# Patient Record
Sex: Female | Born: 1945 | Race: Black or African American | Hispanic: No | Marital: Single | Smoking: Never smoker
Health system: Southern US, Community
[De-identification: ages and names within clinical notes are randomized; demographics above are authoritative.]

## PROBLEM LIST (undated history)

## (undated) DIAGNOSIS — D689 Coagulation defect, unspecified: Secondary | ICD-10-CM

## (undated) DIAGNOSIS — F488 Other specified nonpsychotic mental disorders: Secondary | ICD-10-CM

## (undated) DIAGNOSIS — I10 Essential (primary) hypertension: Secondary | ICD-10-CM

## (undated) DIAGNOSIS — G459 Transient cerebral ischemic attack, unspecified: Secondary | ICD-10-CM

## (undated) DIAGNOSIS — M1990 Unspecified osteoarthritis, unspecified site: Secondary | ICD-10-CM

## (undated) DIAGNOSIS — E785 Hyperlipidemia, unspecified: Secondary | ICD-10-CM

## (undated) DIAGNOSIS — I82409 Acute embolism and thrombosis of unspecified deep veins of unspecified lower extremity: Secondary | ICD-10-CM

## (undated) DIAGNOSIS — M79609 Pain in unspecified limb: Secondary | ICD-10-CM

## (undated) DIAGNOSIS — E119 Type 2 diabetes mellitus without complications: Secondary | ICD-10-CM

## (undated) DIAGNOSIS — I872 Venous insufficiency (chronic) (peripheral): Secondary | ICD-10-CM

## (undated) DIAGNOSIS — K219 Gastro-esophageal reflux disease without esophagitis: Secondary | ICD-10-CM

## (undated) DIAGNOSIS — F32A Depression, unspecified: Secondary | ICD-10-CM

## (undated) DIAGNOSIS — D649 Anemia, unspecified: Secondary | ICD-10-CM

## (undated) DIAGNOSIS — G709 Myoneural disorder, unspecified: Secondary | ICD-10-CM

## (undated) DIAGNOSIS — I2699 Other pulmonary embolism without acute cor pulmonale: Secondary | ICD-10-CM

## (undated) DIAGNOSIS — G4730 Sleep apnea, unspecified: Secondary | ICD-10-CM

## (undated) DIAGNOSIS — L0390 Cellulitis, unspecified: Secondary | ICD-10-CM

## (undated) DIAGNOSIS — Z8719 Personal history of other diseases of the digestive system: Secondary | ICD-10-CM

## (undated) DIAGNOSIS — F329 Major depressive disorder, single episode, unspecified: Secondary | ICD-10-CM

## (undated) HISTORY — DX: Hyperlipidemia, unspecified: E78.5

## (undated) HISTORY — DX: Major depressive disorder, single episode, unspecified: F32.9

## (undated) HISTORY — DX: Acute embolism and thrombosis of unspecified deep veins of unspecified lower extremity: I82.409

## (undated) HISTORY — DX: Transient cerebral ischemic attack, unspecified: G45.9

## (undated) HISTORY — DX: Depression, unspecified: F32.A

## (undated) HISTORY — PX: ABDOMINAL HYSTERECTOMY: SHX81

## (undated) HISTORY — PX: CARPAL TUNNEL RELEASE: SHX101

## (undated) HISTORY — PX: FOOT SURGERY: SHX648

## (undated) HISTORY — PX: JOINT REPLACEMENT: SHX530

## (undated) HISTORY — PX: EYE SURGERY: SHX253

## (undated) HISTORY — DX: Cellulitis, unspecified: L03.90

## (undated) HISTORY — DX: Pain in unspecified limb: M79.609

## (undated) HISTORY — DX: Venous insufficiency (chronic) (peripheral): I87.2

## (undated) HISTORY — PX: REPLACEMENT TOTAL KNEE: SUR1224

## (undated) HISTORY — DX: Unspecified osteoarthritis, unspecified site: M19.90

## (undated) HISTORY — DX: Coagulation defect, unspecified: D68.9

## (undated) HISTORY — DX: Other specified nonpsychotic mental disorders: F48.8

## (undated) HISTORY — DX: Anemia, unspecified: D64.9

## (undated) HISTORY — DX: Other pulmonary embolism without acute cor pulmonale: I26.99

---

## 2009-04-20 ENCOUNTER — Inpatient Hospital Stay (HOSPITAL_COMMUNITY): Admission: EM | Admit: 2009-04-20 | Discharge: 2009-04-21 | Payer: Self-pay | Admitting: Emergency Medicine

## 2010-12-09 LAB — DIFFERENTIAL
Lymphocytes Relative: 6 % — ABNORMAL LOW (ref 12–46)
Lymphs Abs: 0.5 10*3/uL — ABNORMAL LOW (ref 0.7–4.0)
Monocytes Absolute: 0.4 10*3/uL (ref 0.1–1.0)
Monocytes Relative: 4 % (ref 3–12)
Neutro Abs: 8.1 10*3/uL — ABNORMAL HIGH (ref 1.7–7.7)

## 2010-12-09 LAB — URINALYSIS, ROUTINE W REFLEX MICROSCOPIC
Glucose, UA: NEGATIVE mg/dL
Nitrite: NEGATIVE
pH: 5.5 (ref 5.0–8.0)

## 2010-12-09 LAB — CBC
HCT: 38.9 % (ref 36.0–46.0)
MCHC: 32 g/dL (ref 30.0–36.0)
Platelets: 317 10*3/uL (ref 150–400)
RDW: 18.7 % — ABNORMAL HIGH (ref 11.5–15.5)

## 2010-12-09 LAB — HEMOGLOBIN A1C: Hgb A1c MFr Bld: 6.1 % (ref 4.6–6.1)

## 2010-12-09 LAB — PROTIME-INR
INR: 2.6 — ABNORMAL HIGH (ref 0.00–1.49)
INR: 2.9 — ABNORMAL HIGH (ref 0.00–1.49)
Prothrombin Time: 30 seconds — ABNORMAL HIGH (ref 11.6–15.2)

## 2010-12-09 LAB — COMPREHENSIVE METABOLIC PANEL
Albumin: 3.5 g/dL (ref 3.5–5.2)
BUN: 10 mg/dL (ref 6–23)
Calcium: 9 mg/dL (ref 8.4–10.5)
Creatinine, Ser: 0.68 mg/dL (ref 0.4–1.2)
Potassium: 3.7 mEq/L (ref 3.5–5.1)
Total Protein: 7.6 g/dL (ref 6.0–8.3)

## 2010-12-09 LAB — GLUCOSE, CAPILLARY: Glucose-Capillary: 117 mg/dL — ABNORMAL HIGH (ref 70–99)

## 2011-01-16 NOTE — H&P (Signed)
Sharon Glass, Sharon Glass               ACCOUNT NO.:  [id number]   MEDICAL RECORD NO.:  [id number]          PATIENT TYPE:  INP   LOCATION:  3022                         FACILITY:  [facility]   PHYSICIAN:  Virgie Dad, MD     DATE OF BIRTH:  21-Jun-1946   DATE OF ADMISSION:  04/20/2009  DATE OF DISCHARGE:                              HISTORY & PHYSICAL   PRIMARY CARE PHYSICIAN:  Unassigned.   CHIEF COMPLAINT:  Severe headache associated with severe dizziness.   HISTORY OF PRESENT ILLNESS:  The patient had stated that they were on a  bus trip.  While in the bus, she started having sudden severe frontal  headache, the worst headache she ever had.  She became nauseous, but she  did not have any vomiting.  Together with her headache, she felt some  ringing sensation in the head, and she thought she could not hear the  voices around her.  She denied any vomiting, any chest pain, any  weakness.  She said that she been on Zithromax and prednisone and  hydrocortisone cough syrup from her PCP because of acute bronchitis and  that she has asthma, bronchitis, COPD and at this time of the year she  has particular allergy.  She has had this Zithromax, prednisone, and  hydrocodone syrup before.  The patient has stated that the headache was  so severe that she requested to come to the emergency room.     Workup in ED, CT scan did not show any acute intracranial process.  Chest x-ray no acute cardiopulmonary disease.  White count 9100,  hemoglobin 12.4, hematocrit 38.9, platelet count 317, and there is a  neutrophil shift with a white count of 9.1.  She is on Coumadin so PT  was taken and prothrombin time is 27.6, INR of 2.6, sodium 139,  potassium 3.7, glucose 119, BUN 10, creatinine 0.68, calcium 9.  Liver  enzymes negative.  Urinalysis did not show any infection.  She was given  Zofran 4 mg IV for nausea twice, but she continued to be dizzy and  nauseous but no actual vomiting.  She also received  Dilaudid 0.5 mg IV  for her headache but she continues to have headache, meclizine 25 mg  p.o. was also given.  Because of persistent headache, the worst headache  in her life not relieved by Dilaudid and even morphine and severe nausea  not relieved by Zofran and Reglan, the patient was admitted for further  workup.   PAST MEDICAL HISTORY:  She had a TIA 3 years ago and since then on has  been on Coumadin.   CURRENT MEDICAL CONDITIONS:  Asthma, bronchitis, diabetes,  hyperlipidemia, hypertension, pulmonary embolism 5-6 years ago and  history of transient ischemic attack.  Other operations include  hysterectomy, left knee replacement, and carpal tunnel repair of the  right hand.   FAMILY HISTORY:  Hypertension, mother.  Father, hypertension and  diabetes.   CURRENT MEDICATIONS:  Are the following:  1. Advair Diskus twice a day.  2. Aspirin 81 mg once a day.  3. Calcium 600 mg twice a  day.  4. Coumadin 3 mg on odd days and 4 mg on even days.  5. Iron tablet one a day.  6. Furosemide 20 mg daily.  7. Metformin for diabetes 500 mg twice a day.  8. Niaspan for hyperlipidemia 2 at bedtime.  9. Potassium 20 mEq twice a day.  10.Singulair 10 mg daily.  11.Verapamil 120 mg daily.  12.Vitamin D 50,000 units daily.  13.Zetia 10 mg daily and for the past 2 days, prednisone pack.  14.Zithromax once a day.  15.Hydrocodone with acetaminophen cough syrup 1 teaspoon q.4 h.   The patient is diabetic for 7 years.  Asthmatic for a long, long time.  Told to have COPD last year.   PHYSICAL EXAMINATION:  VITAL SIGNS:  On admission, the blood pressure  was 170/80.  When I saw her, the blood pressure was 70/90.  We gave her  IV Lasix 40 mg.  Blood pressure before admission was 140/76.  Pulse is  60, respiration 20, O2 sats at room air 96%.  SKIN:  Warm and dry.  No acute dermatitis.  HEENT:  Pupils are equal and reacting to light.  No nystagmus.  No  papilledema.  No facial asymmetry.  NECK:   Neck veins are flat.  Carotid quiet.  Thyroid gland normal.  CHEST:  Few wheezing.  HEART:  Rate of 60 per minute, regular.  S1 and S2 normal.  No murmur.  No pericardial rub.  ABDOMEN:  Nontender.  No bruit over the aorta.  Normal bowel sounds.  EXTREMITIES:  No DVT.  No pedal edema.  No bruises.  NEUROLOGIC:  Complete neurologic examination.  When the patient is  helped out of bed, she sways side to side and has a tendency to fall  either to the left or the right side.  Romberg test is done, sways side  to side but no nystagmus seen.  Mental exam, Foye Spurling is normal.  Cerebral function normal.  Cerebellar, nose-to-finger-to-nose pointing  ambiguous.  The patient cannot do the test completely because she states  even moving fingers would make her very dizzy and have a headache.  GAIT:  As stated, is ataxic, wide based gait in order to prevent from  falling.  Reflexes are normoactive.  No Babinski.   ASSESSMENT AND PLAN:  1. Severe headache, the worse in her life time associated with      dizziness, with Romberg.  No nystagmus.  Past history of transient      ischemic attack.  Currently, Coumadin for pulmonary embolus history      and transient ischemic attack is therapeutic with an INR of 2.6.      As stated, CT scan does not show any bleed or any acute      intracranial process.  We will continue workup for possible      transient ischemic attack, impending acute cerebrovascular accident      especially the posterior vertebral circulation.  We will order MRI      and MRA and ultrasound of that carotid.  We will not do any deep      venous thrombosis prophylaxis since Coumadin is therapeutic.   1. Hypertension.  Continue furosemide and verapamil.  As stated on      admission, blood pressure was 170/90 and had been given Lasix 40 mg      IV with blood pressure improving to 140/80.  Zofran 4 mg p.r.n. for      nausea and vomiting.  Note, the patient  had received 4 doses of      Zofran  1 morphine, 1 Dilaudid and 1 meclizine.  Nevertheless      continued to have headache and severe dizziness.  We will check      neuro, vital signs.   1. Chronic obstructive pulmonary disease.  Continue Advair.  Stop      hydrocodone with acetaminophen cough syrup.  Stop prednisone and      stop Zithromax.   1. Diabetes.  We will continue metformin.   1. Hyperlipidemia.  We will continue Zetia and Niaspan.   PROGNOSIS:  Guarded.  Concern regarding, severe headache with dizziness,  need to rule out impending CVA from the vertebrobasilar circulation.   ETHICS:  Full code.      Virgie Dad, MD  Electronically Signed     EA/MEDQ  D:  04/20/2009  T:  04/21/2009  Job:  [id number]

## 2011-01-16 NOTE — Discharge Summary (Signed)
Sharon Glass, Sharon Glass               ACCOUNT NO.:  [id number]   MEDICAL RECORD NO.:  [id number]          PATIENT TYPE:  INP   LOCATION:  3022                         FACILITY:  [facility]   PHYSICIAN:  Michelene Gardener, MD    DATE OF BIRTH:  [birth year]   DATE OF ADMISSION:  04/19/2009  DATE OF DISCHARGE:  04/21/2009                               DISCHARGE SUMMARY   DISCHARGE DIAGNOSES:  1. Headache without evidence of cerebrovascular accident in the CAT      scan of her head and MRI of the brain.  2. History of asthma without acute exacerbation.  3. Hypertension.  4. Diabetes mellitus.  5. Hyperlipidemia.  6. History of pulmonary embolism.  7. History of transient ischemic attack.   DISCHARGE MEDICATIONS:  1. Advair Diskus 250/50 one puff twice daily.  2. Coumadin 3 mg in odd days and 4 mg in even days.  3. Lasix 20 mg once a day.  4. Metformin 1000 mg twice daily.  5. Potassium chloride 20 mEq once a day.  6. Singulair 10 mg once a day.  7. Verapamil 120 mg once a day.  8. Zetia 10 mg once a day.  9. Hydrocodone every 4 hours as needed.   CONSULTATIONS:  None.   PROCEDURES:  None.   DIAGNOSTIC STUDIES:  1. Chest x-ray on August 17 showed no acute disease.  2. CT scan of the head without contrast on August 17 showed no      evidence of bleed or any other acute abnormality.  3. MRI of the brain without contrast on August 18 showed artifact      related to dental hardware without acute intracranial abnormality.  4. MRA of the brain showed moderate intracranial atherosclerosis      involving internal carotid artery and proximal basilar artery      without intracranial stenosis.   Follow up with the primary doctor within a week.   COURSE OF HOSPITALIZATION:  This is a 65 year old African American  female, who was admitted to the hospital for evaluation of headache and  possible TIA versus CVA.  The patient presented with severe headache  that initially did not respond to  medications.  The patient had a CAT  scan of her head in the emergency room and that came to be normal.  She  was monitored in telemetry.  Her tele monitor did not show any evidence  of arrhythmia.  The patient responded very well to pain medications  during the hospital.  Her headache resolved.  She does not develop any  new neurological symptoms or signs.  MRI of the brain was done and it  came to be negative.  MRA of the brain was done and it showed no  evidence of stenosis.  Currently, the patient is back to her baseline.  Initially, echocardiogram and carotid Doppler were ordered, but then  were discontinued since her imaging studies were negative for CVA.  To  me, it is only a tension headache, which responded very well to NSAID,  and no further evaluation is needed at this point.  The  patient will be  discharged today in stable condition.  She will follow with her primary  doctor.  She was advised to come to the ER if she develop any new  symptoms.   TOTAL ASSESSMENT TIME:  40 minutes.      Michelene Gardener, MD  Electronically Signed     NAE/MEDQ  D:  04/21/2009  T:  04/21/2009  Job:  [id number] [id number]

## 2011-03-20 ENCOUNTER — Encounter: Payer: Self-pay | Admitting: Vascular Surgery

## 2011-04-10 ENCOUNTER — Encounter: Payer: Self-pay | Admitting: Vascular Surgery

## 2011-04-30 ENCOUNTER — Encounter: Payer: Self-pay | Admitting: Vascular Surgery

## 2011-05-04 ENCOUNTER — Encounter: Payer: Self-pay | Admitting: Vascular Surgery

## 2011-05-08 ENCOUNTER — Ambulatory Visit (INDEPENDENT_AMBULATORY_CARE_PROVIDER_SITE_OTHER): Payer: Medicare Other | Admitting: Vascular Surgery

## 2011-05-08 ENCOUNTER — Encounter: Payer: Self-pay | Admitting: Vascular Surgery

## 2011-05-08 VITALS — Resp 22 | Ht 69.0 in | Wt 258.0 lb

## 2011-05-08 DIAGNOSIS — R609 Edema, unspecified: Secondary | ICD-10-CM

## 2011-05-08 DIAGNOSIS — I83893 Varicose veins of bilateral lower extremities with other complications: Secondary | ICD-10-CM

## 2011-05-08 NOTE — Progress Notes (Signed)
Subjective:     Patient ID: Sharon Glass, female   DOB: 19-Dec-1945, 65 y.o.   MRN: [id number]  HPI this 65 year old female was referred for swelling in the left leg. She has a history of a DVT in the left leg in the 1990s. She was treated briefly with Coumadin. A few years ago she had left knee surgery and postoperatively had a pulmonary embolus. She has been on Coumadin therapy since that time. She has had no recurrent clots in the left leg to her knowledge. The left leg below the knee has been swelling for the last 3-4 months she states that the foot has been swelling for much longer than that. She has no history of stasis ulcers or bleeding. She has been treated recently with anunna boot with some improvement but not for a stasis ulcer. Past Medical History  Diagnosis Date  . Nervous breakdown   . Hyperlipidemia   . Pulmonary embolism   . Asthma   . Cellulitis   . DVT (deep venous thrombosis)   . Venous insufficiency   . TIA (transient ischemic attack)   . Pain in limb   . Arthritis   . Anemia   . Clotting disorder   . Depression     History  Substance Use Topics  . Smoking status: Never Smoker   . Smokeless tobacco: Not on file  . Alcohol Use: No    Family History  Problem Relation Age of Onset  . Diabetes Father     No Known Allergies  Current outpatient prescriptions:amLODipine-benazepril (LOTREL) 10-20 MG per capsule, Take 1 capsule by mouth daily.  , Disp: , Rfl: ;  aspirin 81 MG tablet, Take 81 mg by mouth daily.  , Disp: , Rfl: ;  atenolol (TENORMIN) 100 MG tablet, Take 100 mg by mouth daily. Half a tab daily , Disp: , Rfl: ;  atorvastatin (LIPITOR) 80 MG tablet, Take 80 mg by mouth daily. At bedtime , Disp: , Rfl:  b complex vitamins tablet, Take 1 tablet by mouth daily.  , Disp: , Rfl: ;  calcium carbonate 1250 MG capsule, Take 1,250 mg by mouth 2 (two) times daily with a meal.  , Disp: , Rfl: ;  Fe Fumarate-B12-Vit C-FA-IFC (FEROCON PO), Take by mouth.  , Disp: , Rfl:  ;  fluticasone-salmeterol (ADVAIR HFA) 115-21 MCG/ACT inhaler, Inhale 2 puffs into the lungs 2 (two) times daily.  , Disp: , Rfl:  furosemide (LASIX) 10 MG/ML solution, Take by mouth daily.  , Disp: , Rfl: ;  gabapentin (NEURONTIN) 100 MG capsule, Take 100 mg by mouth 3 (three) times daily.  , Disp: , Rfl: ;  losartan (COZAAR) 100 MG tablet, Take 100 mg by mouth daily.  , Disp: , Rfl: ;  metFORMIN (GLUMETZA) 1000 MG (MOD) 24 hr tablet, Take 1,000 mg by mouth daily with breakfast.  , Disp: , Rfl:  montelukast (SINGULAIR) 10 MG tablet, Take 10 mg by mouth at bedtime.  , Disp: , Rfl: ;  potassium acetate 2 MEQ/ML injection, Inject into the vein.  , Disp: , Rfl: ;  triamcinolone (NASACORT) 55 MCG/ACT nasal inhaler, Place 2 sprays into the nose daily.  , Disp: , Rfl: ;  urea (CARMOL) 10 % cream, Apply topically as needed.  , Disp: , Rfl: ;  Vitamin D, Ergocalciferol, (DRISDOL) 50000 UNITS CAPS, Take 50,000 Units by mouth.  , Disp: , Rfl:  warfarin (COUMADIN) 1 MG tablet, Take 4 mg by mouth as directed. Tues., Thurs., Sat.,  Sun. 1 tab, Disp: , Rfl: ;  warfarin (COUMADIN) 1 MG tablet, Take 3 mg by mouth as directed. Mon., Wed., Fri. 1 tab , Disp: , Rfl:   Resp 22  Ht 5\' 9"  (1.753 m)  Wt 258 lb (117.028 kg)  BMI 38.10 kg/m2  Body mass index is 38.10 kg/(m^2).        Review of Systems review of systems is positive for leg discomfort with walking, numbness in the feet, asthma, arthritis, clotting disorder, anemia, and depression. All other systems are negative and complete review of systems     Objective:   Physical Exam blood pressure 140/70 heart rate 70 respirations 20 Generally she is an obese female is in no apparent distress alert and oriented x3 Cardiovascular regular rate and rhythm with no murmur carotid pulses 3+ no audible bruits HEENT normal for age Chest clear with no rhonchi or wheezing Abdomen is obese no palpable masses 3+ femoral popliteal and dorsalis pedis pulses palpable  bilaterally left leg has 1-2+ edema from the knee distally no varicosities are noted. No hyperpigmentation or active ulceration is noted. Neuro neuro decreased sensation in both feet Musculoskeletal no obvious joint deformities Skin free of rashes    Assessment:     Today I ordered a venous duplex exam which I reviewed and interpreted. There is no DVT. Right saphenous vein has no evidence of reflux in the superficial system. I think the patient has welling in the left leg due to previous DVT and valvular injury to the deep veins. He does continue on Coumadin for her pulmonary embolus.    Plan:     #1 elevation of the foot of the bed 2-3 inches at night #2 short-leg elastic compression stocking to be placed for sling in the morning before arising from bed #3 no further suggestions

## 2011-05-16 NOTE — Procedures (Unsigned)
DUPLEX DEEP VENOUS EXAM - LOWER EXTREMITY  INDICATION:  Left lower extremity edema.  HISTORY:  Edema:  Yes. Trauma/Surgery:  No. Pain:  Yes. PE:  No. Previous DVT:  No. Anticoagulants:  No. Other:  DUPLEX EXAM:               CFV   SFV   PopV  PTV    GSV               R  L  R  L  R  L  R   L  R  L Thrombosis    o  o     o     o      o     o Spontaneous   +  +     +     +      +     + Phasic        +  +     +     +      +     + Augmentation  +  +     +     +      +     + Compressible  +  +     +     +      +     + Competent     +  +     +     +      +     +  Legend:  + - yes  o - no  p - partial  D - decreased   IMPRESSION: 1. No evidence of deep vein thrombosis identified in the left lower     extremity. 2. Patent left great saphenous vein with no evidence of reflux     identified. 3. Contralateral common femoral vein is patent and has normal     spontaneous and phasic flow. 4. Vascularized lymph nodes present involving the bilateral proximal     medial thighs.        _____________________________ Quita Skye. Hart Rochester, M.D.  SH/MEDQ  D:  05/08/2011  T:  05/08/2011  Job:  [id number]

## 2021-05-23 ENCOUNTER — Ambulatory Visit: Payer: Self-pay | Admitting: Student

## 2021-06-15 ENCOUNTER — Encounter (HOSPITAL_COMMUNITY): Payer: Medicare Other

## 2021-06-22 ENCOUNTER — Ambulatory Visit: Admit: 2021-06-22 | Payer: Medicare Other | Admitting: Orthopedic Surgery

## 2021-06-22 SURGERY — ARTHROPLASTY, KNEE, TOTAL, USING IMAGELESS COMPUTER-ASSISTED NAVIGATION
Anesthesia: Spinal | Site: Knee | Laterality: Right

## 2022-02-02 ENCOUNTER — Ambulatory Visit: Payer: Self-pay | Admitting: Student

## 2022-02-02 DIAGNOSIS — E119 Type 2 diabetes mellitus without complications: Secondary | ICD-10-CM

## 2022-02-06 ENCOUNTER — Telehealth: Payer: Self-pay

## 2022-02-06 ENCOUNTER — Other Ambulatory Visit: Payer: Self-pay

## 2022-02-06 DIAGNOSIS — Z86711 Personal history of pulmonary embolism: Secondary | ICD-10-CM

## 2022-02-06 NOTE — Telephone Encounter (Signed)
Received a call from [facility] with [facility]/Dr. Swinteck's office requesting placement of IVC filter with Dr. Randie Heinz prior to her TKR on 02/21/22 due to history of pulmonary embolism.   Spoke with patient who agreed to schedule procedure on 02/12/22 at [facility]. Instructions reviewed and patient verbalized understanding. Dr. Kathline Magic office informed of appointment.

## 2022-02-12 ENCOUNTER — Ambulatory Visit (HOSPITAL_COMMUNITY)
Admission: RE | Admit: 2022-02-12 | Discharge: 2022-02-12 | Disposition: A | Discharge status: Home / Self Care | Payer: Medicare Other | Attending: Vascular Surgery | Admitting: Vascular Surgery

## 2022-02-12 ENCOUNTER — Encounter (HOSPITAL_COMMUNITY): Admission: RE | Admit: 2022-02-12 | Payer: Medicare Other | Source: Ambulatory Visit

## 2022-02-12 ENCOUNTER — Encounter (HOSPITAL_COMMUNITY)
Admission: RE | Disposition: A | Discharge status: Home / Self Care | Payer: Self-pay | Source: Home / Self Care | Attending: Vascular Surgery

## 2022-02-12 ENCOUNTER — Other Ambulatory Visit: Payer: Self-pay

## 2022-02-12 DIAGNOSIS — Z86711 Personal history of pulmonary embolism: Secondary | ICD-10-CM | POA: Insufficient documentation

## 2022-02-12 DIAGNOSIS — Z408 Encounter for other prophylactic surgery: Secondary | ICD-10-CM | POA: Insufficient documentation

## 2022-02-12 DIAGNOSIS — M25561 Pain in right knee: Secondary | ICD-10-CM | POA: Diagnosis not present

## 2022-02-12 DIAGNOSIS — Z01810 Encounter for preprocedural cardiovascular examination: Secondary | ICD-10-CM | POA: Diagnosis not present

## 2022-02-12 DIAGNOSIS — Z86718 Personal history of other venous thrombosis and embolism: Secondary | ICD-10-CM | POA: Insufficient documentation

## 2022-02-12 HISTORY — PX: IVC FILTER INSERTION: CATH118245

## 2022-02-12 LAB — POCT I-STAT, CHEM 8
BUN: 16 mg/dL (ref 8–23)
Calcium, Ion: 1.26 mmol/L (ref 1.15–1.40)
Chloride: 104 mmol/L (ref 98–111)
Creatinine, Ser: 0.8 mg/dL (ref 0.44–1.00)
Glucose, Bld: 100 mg/dL — ABNORMAL HIGH (ref 70–99)
HCT: 39 % (ref 36.0–46.0)
Hemoglobin: 13.3 g/dL (ref 12.0–15.0)
Potassium: 4 mmol/L (ref 3.5–5.1)
Sodium: 143 mmol/L (ref 135–145)
TCO2: 27 mmol/L (ref 22–32)

## 2022-02-12 SURGERY — IVC FILTER INSERTION
Anesthesia: LOCAL

## 2022-02-12 MED ORDER — SODIUM CHLORIDE 0.9 % IV SOLN: INTRAVENOUS | Status: DC

## 2022-02-12 MED ORDER — MIDAZOLAM HCL 2 MG/2ML IJ SOLN
INTRAMUSCULAR | Status: DC | PRN
  Administered 2022-02-12: 1 mg via INTRAVENOUS

## 2022-02-12 MED ORDER — LIDOCAINE HCL (PF) 1 % IJ SOLN
INTRAMUSCULAR | Status: DC | PRN
  Administered 2022-02-12: 15 mL

## 2022-02-12 MED ORDER — HEPARIN (PORCINE) IN NACL 1000-0.9 UT/500ML-% IV SOLN
INTRAVENOUS | Status: DC | PRN
  Administered 2022-02-12: 500 mL

## 2022-02-12 MED ORDER — HEPARIN (PORCINE) IN NACL 1000-0.9 UT/500ML-% IV SOLN
INTRAVENOUS | Status: AC
  Filled 2022-02-12: qty 500

## 2022-02-12 MED ORDER — FENTANYL CITRATE (PF) 100 MCG/2ML IJ SOLN
INTRAMUSCULAR | Status: AC
  Filled 2022-02-12: qty 2

## 2022-02-12 MED ORDER — FENTANYL CITRATE (PF) 100 MCG/2ML IJ SOLN
INTRAMUSCULAR | Status: DC | PRN
  Administered 2022-02-12: 25 ug via INTRAVENOUS

## 2022-02-12 MED ORDER — IODIXANOL 320 MG/ML IV SOLN
INTRAVENOUS | Status: DC | PRN
  Administered 2022-02-12: 30 mL

## 2022-02-12 MED ORDER — MIDAZOLAM HCL 2 MG/2ML IJ SOLN
INTRAMUSCULAR | Status: AC
  Filled 2022-02-12: qty 2

## 2022-02-12 SURGICAL SUPPLY — 9 items
BAG SNAP BAND KOVER 36X36 (MISCELLANEOUS) ×1 IMPLANT
FILTER VC CELECT-FEMORAL (Filter) ×1 IMPLANT
KIT MICROPUNCTURE NIT STIFF (SHEATH) ×1 IMPLANT
KIT PV (KITS) ×2 IMPLANT
SYR MEDRAD MARK V 150ML (SYRINGE) ×1 IMPLANT
TRANSDUCER W/STOPCOCK (MISCELLANEOUS) ×2 IMPLANT
TRAY PV CATH (CUSTOM PROCEDURE TRAY) ×2 IMPLANT
TUBING INJECTOR 48 (MISCELLANEOUS) ×1 IMPLANT
WIRE BENTSON .035X145CM (WIRE) ×1 IMPLANT

## 2022-02-12 NOTE — Op Note (Signed)
    Patient name: Sharon Glass MRN: [id number] DOB: [birth year] Sex: female  02/12/2022 Pre-operative Diagnosis: History of pulmonary embolus with need for right total knee arthroplasty Post-operative diagnosis:  Same Surgeon:  Luanna Salk. Randie Heinz, MD Procedure Performed: 1.  Ultrasound-guided cannulation right common femoral vein 2.  IVC venogram 3.  Placement of Cook Celect IVC filter 4.  Moderate sedation with fentanyl and Versed for 9 minutes  Indications: 76 year old female history of pulmonary embolus from left lower extremity DVT now indicated for right total knee arthroplasty.  We are placing a filter to prevent patient from pulmonary embolus in the perioperative timeframe.  Findings: The IVC measures less than 20 mm.  The filter was deployed in an upright position at the bottom of L2 where the left renal vein was identified by venogram.  We will plan to remove filter and 3 months time if patient has recovered from her surgery without any further plan procedures   Procedure:  The patient was identified in the holding area and taken to room 8.  The patient was then placed supine on the table and prepped and draped in the usual sterile fashion.  A time out was called.  Ultrasound was used to evaluate the right common femoral vein which was patent and compressible.  The area was anesthetized with 1% lidocaine cannulated micropuncture needle followed by wire and sheath.  Images saved the permanent record.  Bentson wire was placed followed by the introducer sheath over the wire.  Venogram was performed.  We placed the introducer sheath to the level of the left renal vein at the bottom of L2.  The filter was then deployed at that location.  An image was saved.  Sheath was removed and pressure held till hemostasis obtained.  She tolerated procedure well without any complication.  Contrast: 30cc   Sua Spadafora C. Randie Heinz, MD [facility] Office: [phone] Pager:  [phone]

## 2022-02-12 NOTE — Progress Notes (Signed)
error 

## 2022-02-12 NOTE — Progress Notes (Signed)
1400- patient ambulated w/ assist to BR. Right groin with dressing cdi.

## 2022-02-12 NOTE — H&P (Signed)
H+P   History of Present Illness: This is a 76 y.o. female with plan for total knee replacement later this month.  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  She is planning for right total knee arthroplasty no future plans for left knee surgery or hip surgery.  She has been consented for filter and presents today.  Past Medical History:  Diagnosis Date   Anemia    Arthritis    Asthma    Cellulitis    Clotting disorder ([place])    Depression    DVT (deep venous thrombosis) (HCC)    Hyperlipidemia    Nervous breakdown    Pain in limb    Pulmonary embolism (HCC)    TIA (transient ischemic attack)    Venous insufficiency     Past Surgical History:  Procedure Laterality Date   ABDOMINAL HYSTERECTOMY     CARPAL TUNNEL RELEASE     FOOT SURGERY     JOINT REPLACEMENT     REPLACEMENT TOTAL KNEE     left    No Known Allergies  Prior to Admission medications   Medication Sig Start Date End Date Taking? Authorizing Provider  amLODipine (NORVASC) 10 MG tablet Take 10 mg by mouth daily.    [provider]  atorvastatin (LIPITOR) 80 MG tablet Take 80 mg by mouth at bedtime.    [provider]  cephALEXin (KEFLEX) 250 MG capsule Take 250 mg by mouth daily. 90 day supply. Started 01/19/22 01/19/22   [provider]  cetirizine (ZYRTEC) 10 MG tablet Take 10 mg by mouth daily.    [provider]  Cyanocobalamin (B-12) 2500 MCG TABS Take 2,500 mcg by mouth daily.    [provider]  donepezil (ARICEPT) 5 MG tablet Take 5 mg by mouth at bedtime.    [provider]  escitalopram (LEXAPRO) 20 MG tablet Take 20 mg by mouth daily. 01/21/22   [provider]  Evening Primrose Oil 500 MG CAPS Take 500 mg by mouth in the morning, at noon, and at bedtime.    [provider]  famotidine (PEPCID) 20 MG tablet Take 20 mg by mouth 2 (two) times daily.    [provider]  ferrous sulfate 325 (65  FE) MG tablet Take 325 mg by mouth daily with breakfast.    [provider]  fluticasone furoate-vilanterol (BREO ELLIPTA) 100-25 MCG/ACT AEPB Inhale 1 puff into the lungs daily as needed (asthma).    [provider]  irbesartan (AVAPRO) 300 MG tablet Take 300 mg by mouth daily. 01/19/22   [provider]  memantine (NAMENDA) 10 MG tablet Take 10 mg by mouth 2 (two) times daily.    [provider]  metFORMIN (GLUCOPHAGE) 500 MG tablet Take 500 mg by mouth daily with breakfast. 01/19/22   [provider]  metoprolol tartrate (LOPRESSOR) 25 MG tablet Take 25 mg by mouth 2 (two) times daily.    [provider]  montelukast (SINGULAIR) 10 MG tablet Take 10 mg by mouth at bedtime.      [provider]  pregabalin (LYRICA) 50 MG capsule Take 50 mg by mouth 3 (three) times daily.    [provider]  vitamin E 180 MG (400 UNITS) capsule Take 400 Units by mouth daily.    [provider]    Social History   Socioeconomic History   Marital status: Single    Spouse name: Not on file   Number  of children: Not on file   Years of education: Not on file   Highest education level: Not on file  Occupational History   Not on file  Tobacco Use   Smoking status: Never   Smokeless tobacco: Not on file  Substance and Sexual Activity   Alcohol use: No   Drug use: No   Sexual activity: Not on file  Other Topics Concern   Not on file  Social History Narrative   Not on file   Social Determinants of Health   Financial Resource Strain: Not on file  Food Insecurity: Not on file  Transportation Needs: Not on file  Physical Activity: Not on file  Stress: Not on file  Social Connections: Not on file  Intimate Partner Violence: Not on file     Family History  Problem Relation Age of Onset   Diabetes Father     ROS: No complaints today   Physical Examination Vitals:   02/12/22 1142  BP: 133/71  Pulse: (!) 53  Temp:  (!) 97.4 F (36.3 C)  SpO2: 100%   Awake alert oriented Nonlabored respirations No swelling bilateral lower extremities   CBC    Component Value Date/Time   WBC 9.1 04/19/2009 1548   RBC 5.01 04/19/2009 1548   HGB 12.4 04/19/2009 1548   HCT 38.9 04/19/2009 1548   PLT 317 04/19/2009 1548   MCV 77.6 (L) 04/19/2009 1548   MCHC 32.0 04/19/2009 1548   RDW 18.7 (H) 04/19/2009 1548   LYMPHSABS 0.5 (L) 04/19/2009 1548   MONOABS 0.4 04/19/2009 1548   EOSABS 0.0 04/19/2009 1548   BASOSABS 0.1 04/19/2009 1548    BMET    Component Value Date/Time   NA 139 04/19/2009 1548   K 3.7 04/19/2009 1548   CL 106 04/19/2009 1548   CO2 28 04/19/2009 1548   GLUCOSE 119 (H) 04/19/2009 1548   BUN 10 04/19/2009 1548   CREATININE 0.68 04/19/2009 1548   CALCIUM 9.0 04/19/2009 1548   GFRNONAA >60 04/19/2009 1548   GFRAA  04/19/2009 1548    >60        The eGFR has been calculated using the MDRD equation. This calculation has not been validated in all clinical situations. eGFR's persistently <60 mL/min signify possible Chronic Kidney Disease.    COAGS: Lab Results  Component Value Date   INR 2.9 (H) 04/21/2009   INR 2.9 (H) 04/20/2009   INR 2.6 (H) 04/19/2009     Non-Invasive Vascular Imaging:   No studies  ASSESSMENT/PLAN: This is a 76 y.o. female history of pulmonary embolism now plan for total knee replacement in 9 days from now.  Plan will be to place IVC filter today and will plan for removal in 3 months if she has recovered from her surgery.   Sharon Glass C. Donzetta Matters, MD [facility] Office: [phone] Pager: [phone]

## 2022-02-13 ENCOUNTER — Encounter (HOSPITAL_COMMUNITY): Payer: Self-pay | Admitting: Vascular Surgery

## 2022-02-15 ENCOUNTER — Other Ambulatory Visit (HOSPITAL_COMMUNITY): Payer: Self-pay

## 2022-02-16 ENCOUNTER — Other Ambulatory Visit (HOSPITAL_COMMUNITY): Payer: Self-pay | Admitting: *Deleted

## 2022-02-19 ENCOUNTER — Encounter (HOSPITAL_COMMUNITY): Payer: Self-pay

## 2022-02-19 ENCOUNTER — Ambulatory Visit: Payer: Self-pay | Admitting: Student

## 2022-02-19 ENCOUNTER — Other Ambulatory Visit: Payer: Self-pay

## 2022-02-19 ENCOUNTER — Encounter (HOSPITAL_COMMUNITY)
Admission: RE | Admit: 2022-02-19 | Discharge: 2022-02-19 | Disposition: A | Discharge status: Home / Self Care | Payer: Medicare Other | Source: Ambulatory Visit | Attending: Orthopedic Surgery | Admitting: Orthopedic Surgery

## 2022-02-19 DIAGNOSIS — Z01818 Encounter for other preprocedural examination: Secondary | ICD-10-CM

## 2022-02-19 DIAGNOSIS — Z01812 Encounter for preprocedural laboratory examination: Secondary | ICD-10-CM | POA: Diagnosis present

## 2022-02-19 DIAGNOSIS — E119 Type 2 diabetes mellitus without complications: Secondary | ICD-10-CM | POA: Insufficient documentation

## 2022-02-19 HISTORY — DX: Sleep apnea, unspecified: G47.30

## 2022-02-19 HISTORY — DX: Personal history of other diseases of the digestive system: Z87.19

## 2022-02-19 HISTORY — DX: Essential (primary) hypertension: I10

## 2022-02-19 HISTORY — DX: Myoneural disorder, unspecified: G70.9

## 2022-02-19 HISTORY — DX: Type 2 diabetes mellitus without complications: E11.9

## 2022-02-19 HISTORY — DX: Gastro-esophageal reflux disease without esophagitis: K21.9

## 2022-02-19 LAB — CBC
HCT: 37.3 % (ref 36.0–46.0)
Hemoglobin: 11.3 g/dL — ABNORMAL LOW (ref 12.0–15.0)
MCH: 26.5 pg (ref 26.0–34.0)
MCHC: 30.3 g/dL (ref 30.0–36.0)
MCV: 87.6 fL (ref 80.0–100.0)
Platelets: 264 10*3/uL (ref 150–400)
RBC: 4.26 MIL/uL (ref 3.87–5.11)
RDW: 17.3 % — ABNORMAL HIGH (ref 11.5–15.5)
WBC: 8.9 10*3/uL (ref 4.0–10.5)
nRBC: 0 % (ref 0.0–0.2)

## 2022-02-19 LAB — BASIC METABOLIC PANEL
Anion gap: 7 (ref 5–15)
BUN: 25 mg/dL — ABNORMAL HIGH (ref 8–23)
CO2: 26 mmol/L (ref 22–32)
Calcium: 9.2 mg/dL (ref 8.9–10.3)
Chloride: 109 mmol/L (ref 98–111)
Creatinine, Ser: 0.83 mg/dL (ref 0.44–1.00)
GFR, Estimated: >60 mL/min (ref >60)
Glucose, Bld: 101 mg/dL — ABNORMAL HIGH (ref 70–99)
Potassium: 4.3 mmol/L (ref 3.5–5.1)
Sodium: 142 mmol/L (ref 135–145)

## 2022-02-19 LAB — HEMOGLOBIN A1C
Hgb A1c MFr Bld: 5.5 % (ref 4.8–5.6)
Mean Plasma Glucose: 111.15 mg/dL

## 2022-02-19 LAB — SURGICAL PCR SCREEN
MRSA, PCR: NEGATIVE
Staphylococcus aureus: NEGATIVE

## 2022-02-19 LAB — GLUCOSE, CAPILLARY: Glucose-Capillary: 91 mg/dL (ref 70–99)

## 2022-02-19 NOTE — Progress Notes (Addendum)
PCP - Sharion Balloon clearance on chart 01-01-22 with LOV  Cardiologist - Dr. Clent Ridges  [facility] pulmonarry clearance on chart 12-13-21 with LOV note  Dr. [facility] LOV note on chart 12-28-21 PFT on chart  PPM/ICD -  Device Orders -  Rep Notified -   Chest x-ray -  EKG - 02-12-22 epic Stress Test -  ECHO -  Cardiac Cath -   Sleep Study -  CPAP -   Fasting Blood Sugar - 90's Checks Blood Sugar __occasional___ Blood Thinner Instructions: Aspirin Instructions:no  ERAS Protcol - PRE-SURGERY Ensure or G2-    COVID vaccine -  Activity--Able to walk a flight of stairs without SOB   Anesthesia review: DVT, TIA, PE HTN, IVC filter placed 02-12-22  Patient denies shortness of breath, fever, cough and chest pain at PAT appointment   All instructions explained to the patient, with a verbal understanding of the material. Patient agrees to go over the instructions while at home for a better understanding. Patient also instructed to self quarantine after being tested for COVID-19. The opportunity to ask questions was provided.

## 2022-02-19 NOTE — H&P (View-Only) (Signed)
TOTAL KNEE ADMISSION H&P  Patient is being admitted for right total knee arthroplasty.  Subjective:  Chief Complaint:right knee pain.  HPI: Sharon Glass, 76 y.o. female, has a history of pain and functional disability in the right knee due to arthritis and has failed non-surgical conservative treatments for greater than 12 weeks to includeNSAID's and/or analgesics, corticosteriod injections, flexibility and strengthening excercises, supervised PT with diminished ADL's post treatment, use of assistive devices, weight reduction as appropriate, and activity modification.  Onset of symptoms was gradual, starting 8 years ago with rapidlly worsening course since that time. The patient noted no past surgery on the right knee(s).  Patient currently rates pain in the right knee(s) at 10 out of 10 with activity. Patient has night pain, worsening of pain with activity and weight bearing, pain that interferes with activities of daily living, pain with passive range of motion, crepitus, and joint swelling.  Patient has evidence of subchondral cysts, subchondral sclerosis, periarticular osteophytes, and joint space narrowing by imaging studies. There is no active infection.  There are no problems to display for this patient.  Past Medical History:  Diagnosis Date   Anemia    Arthritis    Asthma    Cellulitis    Clotting disorder (HCC)    Depression    Diabetes mellitus without complication (HCC)    DVT (deep venous thrombosis) (HCC)    GERD (gastroesophageal reflux disease)    History of hiatal hernia    Hyperlipidemia    Hypertension    Nervous breakdown    Neuromuscular disorder (HCC)    neuropathy feet   Pain in limb    Pulmonary embolism (HCC)    Sleep apnea    wears CPAP   TIA (transient ischemic attack)    Venous insufficiency     Past Surgical History:  Procedure Laterality Date   ABDOMINAL HYSTERECTOMY     CARPAL TUNNEL RELEASE     EYE SURGERY Bilateral    cataract   FOOT SURGERY  Left    IVC FILTER INSERTION N/A 02/12/2022   Procedure: IVC FILTER INSERTION;  Surgeon: Maeola Harman, MD;  Location: [facility] INVASIVE CV LAB;  Service: Cardiovascular;  Laterality: N/A;   REPLACEMENT TOTAL KNEE     left    Current Outpatient Medications  Medication Sig Dispense Refill Last Dose   amLODipine (NORVASC) 10 MG tablet Take 10 mg by mouth daily.      atorvastatin (LIPITOR) 80 MG tablet Take 80 mg by mouth at bedtime.      cephALEXin (KEFLEX) 250 MG capsule Take 250 mg by mouth daily. 90 day supply. Started 01/19/22      cetirizine (ZYRTEC) 10 MG tablet Take 10 mg by mouth daily.      Cyanocobalamin (B-12) 2500 MCG TABS Take 2,500 mcg by mouth daily.      donepezil (ARICEPT) 5 MG tablet Take 5 mg by mouth at bedtime.      escitalopram (LEXAPRO) 20 MG tablet Take 20 mg by mouth daily.      Evening Primrose Oil 500 MG CAPS Take 500 mg by mouth in the morning, at noon, and at bedtime.      famotidine (PEPCID) 20 MG tablet Take 20 mg by mouth 2 (two) times daily.      ferrous sulfate 325 (65 FE) MG tablet Take 325 mg by mouth daily with breakfast.      fluticasone furoate-vilanterol (BREO ELLIPTA) 100-25 MCG/ACT AEPB Inhale 1 puff into the lungs daily as needed (  asthma).      irbesartan (AVAPRO) 300 MG tablet Take 300 mg by mouth daily.      memantine (NAMENDA) 10 MG tablet Take 10 mg by mouth 2 (two) times daily.      metFORMIN (GLUCOPHAGE) 500 MG tablet Take 500 mg by mouth daily with breakfast.      metoprolol tartrate (LOPRESSOR) 25 MG tablet Take 25 mg by mouth 2 (two) times daily.      montelukast (SINGULAIR) 10 MG tablet Take 10 mg by mouth at bedtime.        pregabalin (LYRICA) 50 MG capsule Take 50 mg by mouth 3 (three) times daily.      vitamin E 180 MG (400 UNITS) capsule Take 400 Units by mouth daily.      No current facility-administered medications for this visit.   No Known Allergies  Social History   Tobacco Use   Smoking status: Never   Smokeless  tobacco: Not on file  Substance Use Topics   Alcohol use: Never    Family History  Problem Relation Age of Onset   Diabetes Father      Review of Systems  Cardiovascular:  Positive for leg swelling.  Musculoskeletal:  Positive for arthralgias, gait problem, joint swelling and myalgias.  All other systems reviewed and are negative.   Objective:  Physical Exam Constitutional:      Appearance: Normal appearance. She is obese.  HENT:     Head: Normocephalic and atraumatic.     Nose: Nose normal.     Mouth/Throat:     Mouth: Mucous membranes are moist.     Pharynx: Oropharynx is clear.  Eyes:     Extraocular Movements: Extraocular movements intact.  Cardiovascular:     Rate and Rhythm: Normal rate and regular rhythm.     Pulses: Normal pulses.     Heart sounds: Normal heart sounds.  Pulmonary:     Breath sounds: Normal breath sounds.  Abdominal:     General: Abdomen is flat.     Palpations: Abdomen is soft.  Genitourinary:    Comments: deferred Musculoskeletal:     Cervical back: Normal range of motion and neck supple.     Comments: Examination of the right knee examination of the right knee reveals no skin wounds, lesions, erythema, or rashes. She has some swelling. Tenderness palpation medial joint line, lateral joint line, peripatellar retinacular tissues with positive grind sign. Valgus deformity. Range of motion 0 to 105 degrees. Painless range of motion of the hip.  Sensory and motor function intact in LE bilaterally. Distal pulses 2+ bilaterally.  Mild pedal edema at baseline. Calves soft and non-tender.   Skin:    General: Skin is warm and dry.     Capillary Refill: Capillary refill takes less than 2 seconds.  Neurological:     General: No focal deficit present.     Mental Status: She is alert and oriented to person, place, and time.  Psychiatric:        Mood and Affect: Mood normal.        Behavior: Behavior normal.        Thought Content: Thought content  normal.        Judgment: Judgment normal.     Vital signs in last 24 hours: @VSRANGES @  Labs:   Estimated body mass index is 38.55 kg/m as calculated from the following:   Height as of an earlier encounter on 02/19/22: 5\' 9"  (1.753 m).   Weight as of  an earlier encounter on 02/19/22: 118.4 kg.   Imaging Review Plain radiographs demonstrate severe degenerative joint disease of the right knee(s). The overall alignment ismild valgus. The bone quality appears to be adequate for age and reported activity level.      Assessment/Plan:  End stage arthritis, right knee   The patient history, physical examination, clinical judgment of the provider and imaging studies are consistent with end stage degenerative joint disease of the right knee(s) and total knee arthroplasty is deemed medically necessary. The treatment options including medical management, injection therapy arthroscopy and arthroplasty were discussed at length. The risks and benefits of total knee arthroplasty were presented and reviewed. The risks due to aseptic loosening, infection, stiffness, patella tracking problems, thromboembolic complications and other imponderables were discussed. The patient acknowledged the explanation, agreed to proceed with the plan and consent was signed. Patient is being admitted for inpatient treatment for surgery, pain control, PT, OT, prophylactic antibiotics, VTE prophylaxis, progressive ambulation and ADL's and discharge planning. The patient is planning to be discharged home after an overnight stay with OPPT.   Therapy Plans: outpatient therapy in [facility]. Rx provided today.  Disposition: Home with husband Planned DVT Prophylaxis: Xarelto DME needed: walker. Iceman today.  PCP: Cleared Cardiology: Cleared, IVC filter placement, 02/12/22. Dr. Randie Heinz.  Pulmonology: Cleared.  TXA: Yes Allergies:  - ACE inhibitors - patient doesn't know reaction  Anesthesia Concerns: None. BMI: 38.5 Last  HgbA1c: 6.0 Other: - Hgb 11.3 - Cr: 0.85 - Sleep apnea - COPD - IVC filter placement, DVT/PE history, clotting disorder.  - Diabetes - TIA   Patient's anticipated LOS is less than 2 midnights, meeting these requirements: - Younger than 37 - Lives within 1 hour of care - Has a competent adult at home to recover with post-op recover - NO history of  - Chronic pain requiring opioids  - Coronary Artery Disease  - Heart failure  - Heart attack  - Cardiac arrhythmia  - Renal failure  - Anemia  - Advanced Liver disease

## 2022-02-19 NOTE — H&P (Signed)
TOTAL KNEE ADMISSION H&P  Patient is being admitted for right total knee arthroplasty.  Subjective:  Chief Complaint:right knee pain.  HPI: Sharon Glass, 76 y.o. female, has a history of pain and functional disability in the right knee due to arthritis and has failed non-surgical conservative treatments for greater than 12 weeks to includeNSAID's and/or analgesics, corticosteriod injections, flexibility and strengthening excercises, supervised PT with diminished ADL's post treatment, use of assistive devices, weight reduction as appropriate, and activity modification.  Onset of symptoms was gradual, starting 8 years ago with rapidlly worsening course since that time. The patient noted no past surgery on the right knee(s).  Patient currently rates pain in the right knee(s) at 10 out of 10 with activity. Patient has night pain, worsening of pain with activity and weight bearing, pain that interferes with activities of daily living, pain with passive range of motion, crepitus, and joint swelling.  Patient has evidence of subchondral cysts, subchondral sclerosis, periarticular osteophytes, and joint space narrowing by imaging studies. There is no active infection.  There are no problems to display for this patient.  Past Medical History:  Diagnosis Date   Anemia    Arthritis    Asthma    Cellulitis    Clotting disorder (HCC)    Depression    Diabetes mellitus without complication (HCC)    DVT (deep venous thrombosis) (HCC)    GERD (gastroesophageal reflux disease)    History of hiatal hernia    Hyperlipidemia    Hypertension    Nervous breakdown    Neuromuscular disorder (HCC)    neuropathy feet   Pain in limb    Pulmonary embolism (HCC)    Sleep apnea    wears CPAP   TIA (transient ischemic attack)    Venous insufficiency     Past Surgical History:  Procedure Laterality Date   ABDOMINAL HYSTERECTOMY     CARPAL TUNNEL RELEASE     EYE SURGERY Bilateral    cataract   FOOT SURGERY  Left    IVC FILTER INSERTION N/A 02/12/2022   Procedure: IVC FILTER INSERTION;  Surgeon: Maeola Harman, MD;  Location: [facility] INVASIVE CV LAB;  Service: Cardiovascular;  Laterality: N/A;   REPLACEMENT TOTAL KNEE     left    Current Outpatient Medications  Medication Sig Dispense Refill Last Dose   amLODipine (NORVASC) 10 MG tablet Take 10 mg by mouth daily.      atorvastatin (LIPITOR) 80 MG tablet Take 80 mg by mouth at bedtime.      cephALEXin (KEFLEX) 250 MG capsule Take 250 mg by mouth daily. 90 day supply. Started 01/19/22      cetirizine (ZYRTEC) 10 MG tablet Take 10 mg by mouth daily.      Cyanocobalamin (B-12) 2500 MCG TABS Take 2,500 mcg by mouth daily.      donepezil (ARICEPT) 5 MG tablet Take 5 mg by mouth at bedtime.      escitalopram (LEXAPRO) 20 MG tablet Take 20 mg by mouth daily.      Evening Primrose Oil 500 MG CAPS Take 500 mg by mouth in the morning, at noon, and at bedtime.      famotidine (PEPCID) 20 MG tablet Take 20 mg by mouth 2 (two) times daily.      ferrous sulfate 325 (65 FE) MG tablet Take 325 mg by mouth daily with breakfast.      fluticasone furoate-vilanterol (BREO ELLIPTA) 100-25 MCG/ACT AEPB Inhale 1 puff into the lungs daily as needed (  asthma).      irbesartan (AVAPRO) 300 MG tablet Take 300 mg by mouth daily.      memantine (NAMENDA) 10 MG tablet Take 10 mg by mouth 2 (two) times daily.      metFORMIN (GLUCOPHAGE) 500 MG tablet Take 500 mg by mouth daily with breakfast.      metoprolol tartrate (LOPRESSOR) 25 MG tablet Take 25 mg by mouth 2 (two) times daily.      montelukast (SINGULAIR) 10 MG tablet Take 10 mg by mouth at bedtime.        pregabalin (LYRICA) 50 MG capsule Take 50 mg by mouth 3 (three) times daily.      vitamin E 180 MG (400 UNITS) capsule Take 400 Units by mouth daily.      No current facility-administered medications for this visit.   No Known Allergies  Social History   Tobacco Use   Smoking status: Never   Smokeless  tobacco: Not on file  Substance Use Topics   Alcohol use: Never    Family History  Problem Relation Age of Onset   Diabetes Father      Review of Systems  Cardiovascular:  Positive for leg swelling.  Musculoskeletal:  Positive for arthralgias, gait problem, joint swelling and myalgias.  All other systems reviewed and are negative.   Objective:  Physical Exam Constitutional:      Appearance: Normal appearance. She is obese.  HENT:     Head: Normocephalic and atraumatic.     Nose: Nose normal.     Mouth/Throat:     Mouth: Mucous membranes are moist.     Pharynx: Oropharynx is clear.  Eyes:     Extraocular Movements: Extraocular movements intact.  Cardiovascular:     Rate and Rhythm: Normal rate and regular rhythm.     Pulses: Normal pulses.     Heart sounds: Normal heart sounds.  Pulmonary:     Breath sounds: Normal breath sounds.  Abdominal:     General: Abdomen is flat.     Palpations: Abdomen is soft.  Genitourinary:    Comments: deferred Musculoskeletal:     Cervical back: Normal range of motion and neck supple.     Comments: Examination of the right knee examination of the right knee reveals no skin wounds, lesions, erythema, or rashes. She has some swelling. Tenderness palpation medial joint line, lateral joint line, peripatellar retinacular tissues with positive grind sign. Valgus deformity. Range of motion 0 to 105 degrees. Painless range of motion of the hip.  Sensory and motor function intact in LE bilaterally. Distal pulses 2+ bilaterally.  Mild pedal edema at baseline. Calves soft and non-tender.   Skin:    General: Skin is warm and dry.     Capillary Refill: Capillary refill takes less than 2 seconds.  Neurological:     General: No focal deficit present.     Mental Status: She is alert and oriented to person, place, and time.  Psychiatric:        Mood and Affect: Mood normal.        Behavior: Behavior normal.        Thought Content: Thought content  normal.        Judgment: Judgment normal.     Vital signs in last 24 hours: @VSRANGES@  Labs:   Estimated body mass index is 38.55 kg/m as calculated from the following:   Height as of an earlier encounter on 02/19/22: 5' 9" (1.753 m).   Weight as of   an earlier encounter on 02/19/22: 118.4 kg.   Imaging Review Plain radiographs demonstrate severe degenerative joint disease of the right knee(s). The overall alignment ismild valgus. The bone quality appears to be adequate for age and reported activity level.      Assessment/Plan:  End stage arthritis, right knee   The patient history, physical examination, clinical judgment of the provider and imaging studies are consistent with end stage degenerative joint disease of the right knee(s) and total knee arthroplasty is deemed medically necessary. The treatment options including medical management, injection therapy arthroscopy and arthroplasty were discussed at length. The risks and benefits of total knee arthroplasty were presented and reviewed. The risks due to aseptic loosening, infection, stiffness, patella tracking problems, thromboembolic complications and other imponderables were discussed. The patient acknowledged the explanation, agreed to proceed with the plan and consent was signed. Patient is being admitted for inpatient treatment for surgery, pain control, PT, OT, prophylactic antibiotics, VTE prophylaxis, progressive ambulation and ADL's and discharge planning. The patient is planning to be discharged home after an overnight stay with OPPT.   Therapy Plans: outpatient therapy in [facility]. Rx provided today.  Disposition: Home with husband Planned DVT Prophylaxis: Xarelto DME needed: walker. Iceman today.  PCP: Cleared Cardiology: Cleared, IVC filter placement, 02/12/22. Dr. Randie Heinz.  Pulmonology: Cleared.  TXA: Yes Allergies:  - ACE inhibitors - patient doesn't know reaction  Anesthesia Concerns: None. BMI: 38.5 Last  HgbA1c: 6.0 Other: - Hgb 11.3 - Cr: 0.85 - Sleep apnea - COPD - IVC filter placement, DVT/PE history, clotting disorder.  - Diabetes - TIA   Patient's anticipated LOS is less than 2 midnights, meeting these requirements: - Younger than 37 - Lives within 1 hour of care - Has a competent adult at home to recover with post-op recover - NO history of  - Chronic pain requiring opioids  - Coronary Artery Disease  - Heart failure  - Heart attack  - Cardiac arrhythmia  - Renal failure  - Anemia  - Advanced Liver disease

## 2022-02-19 NOTE — Patient Instructions (Signed)
DUE TO COVID-19 ONLY TWO VISITORS  (aged 78 and older)  ARE ALLOWED TO COME WITH YOU AND STAY IN THE WAITING ROOM ONLY DURING PRE OP AND PROCEDURE.   **NO VISITORS ARE ALLOWED IN THE SHORT STAY AREA OR RECOVERY ROOM!!**  IF YOU WILL BE ADMITTED INTO THE HOSPITAL YOU ARE ALLOWED ONLY FOUR SUPPORT PEOPLE DURING VISITATION HOURS ONLY (7 AM -8PM)   The support person(s) must pass our screening, gel in and out, and wear a mask at all times, including in the patient's room. Patients must also wear a mask when staff or their support person are in the room. Visitors GUEST BADGE MUST BE WORN VISIBLY  One adult visitor may remain with you overnight and MUST be in the room by 8 P.M.     Your procedure is scheduled on: 02-21-22   Report to [facility] Main Entrance    Report to admitting at      900 AM   Call this number if you have problems the morning of surgery [phone]   Do not eat food :After Midnight.   After Midnight you may have the following liquids until __0830____ AM DAY OF    then nothing by mouth  Water Black Coffee (sugar ok, NO MILK/CREAM OR CREAMERS)  Tea (sugar ok, NO MILK/CREAM OR CREAMERS) regular and decaf                             Plain Jell-O (NO RED)                                           Fruit ices (not with fruit pulp, NO RED)                                     Popsicles (NO RED)                                                                  Juice: apple, WHITE grape, WHITE cranberry Sports drinks like Gatorade (NO RED) Clear broth(vegetable,chicken,beef)                     The day of surgery:  Drink ONE (1) Pre-Surgery Clear  G2 at 0830  AM the morning of surgery. Drink in one sitting. Do not sip.  This drink was given to you during your hospital  pre-op appointment visit. Nothing else to drink after completing the  Pre-Surgery G2.          If you have questions, please contact your surgeon's office.   FOLLOW BOWEL PREP AND ANY  ADDITIONAL PRE OP INSTRUCTIONS YOU RECEIVED FROM YOUR SURGEON'S OFFICE!!!     Oral Hygiene is also important to reduce your risk of infection.                                    Remember - BRUSH YOUR TEETH THE MORNING OF SURGERY WITH YOUR REGULAR  TOOTHPASTE   Do NOT smoke after Midnight   Take these medicines the morning of surgery with A SIP OF WATER: Lyrica, escitalopram,cetrizine, Inhalers Bring with you , pepcid, memantine, metoprolol, amlodipine  DO NOT TAKE ANY ORAL DIABETIC MEDICATIONS DAY OF YOUR SURGERY  Bring CPAP mask and tubing day of surgery.                              You may not have any metal on your body including hair pins, jewelry, and body piercing             Do not wear make-up, lotions, powders, perfumes/cologne, or deodorant  Do not wear nail polish including gel and S&S, artificial/acrylic nails, or any other type of covering on natural nails including finger and toenails. If you have artificial nails, gel coating, etc. that needs to be removed by a nail salon please have this removed prior to surgery or surgery may need to be canceled/ delayed if the surgeon/ anesthesia feels like they are unable to be safely monitored.   Do not shave  48 hours prior to surgery.               Do not bring valuables to the hospital. [facility] IS NOT             RESPONSIBLE   FOR VALUABLES.   Contacts, dentures or bridgework may not be worn into surgery.   Bring small overnight bag day of surgery.   DO NOT BRING YOUR HOME MEDICATIONS TO THE HOSPITAL. PHARMACY WILL DISPENSE MEDICATIONS LISTED ON YOUR MEDICATION LIST TO YOU DURING YOUR ADMISSION IN THE HOSPITAL!    Patients discharged on the day of surgery will not be allowed to drive home.  Someone NEEDS to stay with you for the first 24 hours after anesthesia.                Please read over the following fact sheets you were given: IF YOU HAVE QUESTIONS ABOUT YOUR PRE-OP INSTRUCTIONS PLEASE CALL [phone]      [facility] - Preparing for Surgery Before surgery, you can play an important role.  Because skin is not sterile, your skin needs to be as free of germs as possible.  You can reduce the number of germs on your skin by washing with CHG (chlorahexidine gluconate) soap before surgery.  CHG is an antiseptic cleaner which kills germs and bonds with the skin to continue killing germs even after washing. Please DO NOT use if you have an allergy to CHG or antibacterial soaps.  If your skin becomes reddened/irritated stop using the CHG and inform your nurse when you arrive at Short Stay. Do not shave (including legs and underarms) for at least 48 hours prior to the first CHG shower.  You may shave your face/neck. Please follow these instructions carefully:  1.  Shower with CHG Soap the night before surgery and the  morning of Surgery.  2.  If you choose to wash your hair, wash your hair first as usual with your  normal  shampoo.  3.  After you shampoo, rinse your hair and body thoroughly to remove the  shampoo.                           4.  Use CHG as you would any other liquid soap.  You can apply chg directly  to  the skin and wash                       Gently with a scrungie or clean washcloth.  5.  Apply the CHG Soap to your body ONLY FROM THE NECK DOWN.   Do not use on face/ open                           Wound or open sores. Avoid contact with eyes, ears mouth and genitals (private parts).                       Wash face,  Genitals (private parts) with your normal soap.             6.  Wash thoroughly, paying special attention to the area where your surgery  will be performed.  7.  Thoroughly rinse your body with warm water from the neck down.  8.  DO NOT shower/wash with your normal soap after using and rinsing off  the CHG Soap.                9.  Pat yourself dry with a clean towel.            10.  Wear clean pajamas.            11.  Place clean sheets on your bed the night of your first shower and  do not  sleep with pets. Day of Surgery : Do not apply any lotions/deodorants the morning of surgery.  Please wear clean clothes to the hospital/surgery center.  FAILURE TO FOLLOW THESE INSTRUCTIONS MAY RESULT IN THE CANCELLATION OF YOUR SURGERY PATIENT SIGNATURE_________________________________  NURSE SIGNATURE__________________________________  ________________________________________________________________________   Rogelia Mire  An incentive spirometer is a tool that can help keep your lungs clear and active. This tool measures how well you are filling your lungs with each breath. Taking long deep breaths may help reverse or decrease the chance of developing breathing (pulmonary) problems (especially infection) following: A long period of time when you are unable to move or be active. BEFORE THE PROCEDURE  If the spirometer includes an indicator to show your best effort, your nurse or respiratory therapist will set it to a desired goal. If possible, sit up straight or lean slightly forward. Try not to slouch. Hold the incentive spirometer in an upright position. INSTRUCTIONS FOR USE  Sit on the edge of your bed if possible, or sit up as far as you can in bed or on a chair. Hold the incentive spirometer in an upright position. Breathe out normally. Place the mouthpiece in your mouth and seal your lips tightly around it. Breathe in slowly and as deeply as possible, raising the piston or the ball toward the top of the column. Hold your breath for 3-5 seconds or for as long as possible. Allow the piston or ball to fall to the bottom of the column. Remove the mouthpiece from your mouth and breathe out normally. Rest for a few seconds and repeat Steps 1 through 7 at least 10 times every 1-2 hours when you are awake. Take your time and take a few normal breaths between deep breaths. The spirometer may include an indicator to show your best effort. Use the indicator as a goal to work  toward during each repetition. After each set of 10 deep breaths, practice coughing to be sure your lungs are clear.  If you have an incision (the cut made at the time of surgery), support your incision when coughing by placing a pillow or rolled up towels firmly against it. Once you are able to get out of bed, walk around indoors and cough well. You may stop using the incentive spirometer when instructed by your caregiver.  RISKS AND COMPLICATIONS Take your time so you do not get dizzy or light-headed. If you are in pain, you may need to take or ask for pain medication before doing incentive spirometry. It is harder to take a deep breath if you are having pain. AFTER USE Rest and breathe slowly and easily. It can be helpful to keep track of a log of your progress. Your caregiver can provide you with a simple table to help with this. If you are using the spirometer at home, follow these instructions: [place] IF:  You are having difficultly using the spirometer. You have trouble using the spirometer as often as instructed. Your pain medication is not giving enough relief while using the spirometer. You develop fever of 100.5 F (38.1 C) or higher. SEEK IMMEDIATE MEDICAL CARE IF:  You cough up bloody sputum that had not been present before. You develop fever of 102 F (38.9 C) or greater. You develop worsening pain at or near the incision site. MAKE SURE YOU:  Understand these instructions. Will watch your condition. Will get help right away if you are not doing well or get worse. Document Released: 12/31/2006 Document Revised: 11/12/2011 Document Reviewed: 03/03/2007 [facility] Patient Information 2014 [place], [place].   ________________________________________________________________________

## 2022-02-21 ENCOUNTER — Encounter (HOSPITAL_COMMUNITY)
Admission: RE | Disposition: A | Discharge status: Home / Self Care | Payer: Self-pay | Source: Home / Self Care | Attending: Orthopedic Surgery

## 2022-02-21 ENCOUNTER — Encounter (HOSPITAL_COMMUNITY): Payer: Self-pay | Admitting: Orthopedic Surgery

## 2022-02-21 ENCOUNTER — Ambulatory Visit (HOSPITAL_BASED_OUTPATIENT_CLINIC_OR_DEPARTMENT_OTHER): Payer: Medicare Other | Admitting: Anesthesiology

## 2022-02-21 ENCOUNTER — Ambulatory Visit (HOSPITAL_COMMUNITY)
Admission: RE | Admit: 2022-02-21 | Discharge: 2022-02-22 | Disposition: A | Discharge status: Home / Self Care | Payer: Medicare Other | Attending: Orthopedic Surgery | Admitting: Orthopedic Surgery

## 2022-02-21 ENCOUNTER — Other Ambulatory Visit: Payer: Self-pay

## 2022-02-21 ENCOUNTER — Ambulatory Visit (HOSPITAL_COMMUNITY): Payer: Medicare Other | Admitting: Physician Assistant

## 2022-02-21 ENCOUNTER — Ambulatory Visit (HOSPITAL_COMMUNITY): Payer: Medicare Other

## 2022-02-21 DIAGNOSIS — Z86711 Personal history of pulmonary embolism: Secondary | ICD-10-CM | POA: Insufficient documentation

## 2022-02-21 DIAGNOSIS — G4733 Obstructive sleep apnea (adult) (pediatric): Secondary | ICD-10-CM

## 2022-02-21 DIAGNOSIS — I10 Essential (primary) hypertension: Secondary | ICD-10-CM

## 2022-02-21 DIAGNOSIS — M1711 Unilateral primary osteoarthritis, right knee: Secondary | ICD-10-CM | POA: Diagnosis present

## 2022-02-21 DIAGNOSIS — Z7984 Long term (current) use of oral hypoglycemic drugs: Secondary | ICD-10-CM | POA: Diagnosis not present

## 2022-02-21 DIAGNOSIS — Z8673 Personal history of transient ischemic attack (TIA), and cerebral infarction without residual deficits: Secondary | ICD-10-CM | POA: Diagnosis not present

## 2022-02-21 DIAGNOSIS — D649 Anemia, unspecified: Secondary | ICD-10-CM | POA: Insufficient documentation

## 2022-02-21 DIAGNOSIS — D759 Disease of blood and blood-forming organs, unspecified: Secondary | ICD-10-CM | POA: Insufficient documentation

## 2022-02-21 DIAGNOSIS — E1142 Type 2 diabetes mellitus with diabetic polyneuropathy: Secondary | ICD-10-CM | POA: Insufficient documentation

## 2022-02-21 DIAGNOSIS — Z86718 Personal history of other venous thrombosis and embolism: Secondary | ICD-10-CM | POA: Diagnosis not present

## 2022-02-21 DIAGNOSIS — G4730 Sleep apnea, unspecified: Secondary | ICD-10-CM | POA: Insufficient documentation

## 2022-02-21 DIAGNOSIS — Z9989 Dependence on other enabling machines and devices: Secondary | ICD-10-CM | POA: Diagnosis not present

## 2022-02-21 DIAGNOSIS — Z01818 Encounter for other preprocedural examination: Secondary | ICD-10-CM

## 2022-02-21 DIAGNOSIS — E785 Hyperlipidemia, unspecified: Secondary | ICD-10-CM | POA: Diagnosis not present

## 2022-02-21 DIAGNOSIS — J45909 Unspecified asthma, uncomplicated: Secondary | ICD-10-CM | POA: Insufficient documentation

## 2022-02-21 DIAGNOSIS — K219 Gastro-esophageal reflux disease without esophagitis: Secondary | ICD-10-CM | POA: Insufficient documentation

## 2022-02-21 DIAGNOSIS — E119 Type 2 diabetes mellitus without complications: Secondary | ICD-10-CM

## 2022-02-21 DIAGNOSIS — F32A Depression, unspecified: Secondary | ICD-10-CM | POA: Insufficient documentation

## 2022-02-21 DIAGNOSIS — G709 Myoneural disorder, unspecified: Secondary | ICD-10-CM | POA: Insufficient documentation

## 2022-02-21 DIAGNOSIS — E669 Obesity, unspecified: Secondary | ICD-10-CM | POA: Diagnosis not present

## 2022-02-21 DIAGNOSIS — Z6838 Body mass index (BMI) 38.0-38.9, adult: Secondary | ICD-10-CM | POA: Diagnosis not present

## 2022-02-21 DIAGNOSIS — K449 Diaphragmatic hernia without obstruction or gangrene: Secondary | ICD-10-CM | POA: Diagnosis not present

## 2022-02-21 DIAGNOSIS — Z96651 Presence of right artificial knee joint: Secondary | ICD-10-CM

## 2022-02-21 LAB — GLUCOSE, CAPILLARY
Glucose-Capillary: 109 mg/dL — ABNORMAL HIGH (ref 70–99)
Glucose-Capillary: 135 mg/dL — ABNORMAL HIGH (ref 70–99)
Glucose-Capillary: 148 mg/dL — ABNORMAL HIGH (ref 70–99)
Glucose-Capillary: 238 mg/dL — ABNORMAL HIGH (ref 70–99)

## 2022-02-21 SURGERY — ARTHROPLASTY, KNEE, TOTAL, USING IMAGELESS COMPUTER-ASSISTED NAVIGATION
Anesthesia: Spinal | Site: Knee | Laterality: Right

## 2022-02-21 MED ORDER — POLYETHYLENE GLYCOL 3350 17 G PO PACK: 17.0000 g | PACK | Freq: Every day | ORAL | Status: DC | PRN

## 2022-02-21 MED ORDER — CHLORHEXIDINE GLUCONATE 0.12 % MT SOLN
15.0000 mL | Freq: Once | OROMUCOSAL | Status: AC
  Administered 2022-02-21: 15 mL via OROMUCOSAL

## 2022-02-21 MED ORDER — DIPHENHYDRAMINE HCL 12.5 MG/5ML PO ELIX: 12.5000 mg | ORAL_SOLUTION | ORAL | Status: DC | PRN

## 2022-02-21 MED ORDER — CEPHALEXIN 250 MG PO CAPS
250.0000 mg | ORAL_CAPSULE | Freq: Every day | ORAL | Status: DC
  Administered 2022-02-22: 250 mg via ORAL
  Filled 2022-02-21: qty 1

## 2022-02-21 MED ORDER — DONEPEZIL HCL 10 MG PO TABS
5.0000 mg | ORAL_TABLET | Freq: Every day | ORAL | Status: DC
  Administered 2022-02-21: 5 mg via ORAL
  Filled 2022-02-21: qty 1

## 2022-02-21 MED ORDER — FAMOTIDINE 20 MG PO TABS
20.0000 mg | ORAL_TABLET | Freq: Two times a day (BID) | ORAL | Status: DC
  Administered 2022-02-21 – 2022-02-22 (×2): 20 mg via ORAL
  Filled 2022-02-21 (×2): qty 1

## 2022-02-21 MED ORDER — SENNA 8.6 MG PO TABS
1.0000 | ORAL_TABLET | Freq: Two times a day (BID) | ORAL | Status: DC
  Administered 2022-02-21 – 2022-02-22 (×2): 8.6 mg via ORAL
  Filled 2022-02-21 (×2): qty 1

## 2022-02-21 MED ORDER — PANTOPRAZOLE SODIUM 40 MG PO TBEC
40.0000 mg | DELAYED_RELEASE_TABLET | Freq: Every day | ORAL | Status: DC
  Administered 2022-02-21 – 2022-02-22 (×2): 40 mg via ORAL
  Filled 2022-02-21 (×2): qty 1

## 2022-02-21 MED ORDER — MEMANTINE HCL 10 MG PO TABS
10.0000 mg | ORAL_TABLET | Freq: Two times a day (BID) | ORAL | Status: DC
  Administered 2022-02-21 – 2022-02-22 (×2): 10 mg via ORAL
  Filled 2022-02-21 (×2): qty 1

## 2022-02-21 MED ORDER — ACETAMINOPHEN 325 MG PO TABS: 325.0000 mg | ORAL_TABLET | Freq: Four times a day (QID) | ORAL | Status: DC | PRN

## 2022-02-21 MED ORDER — AMLODIPINE BESYLATE 10 MG PO TABS: 10.0000 mg | ORAL_TABLET | Freq: Every day | ORAL | Status: DC

## 2022-02-21 MED ORDER — 0.9 % SODIUM CHLORIDE (POUR BTL) OPTIME
TOPICAL | Status: DC | PRN
  Administered 2022-02-21: 1000 mL

## 2022-02-21 MED ORDER — FLUTICASONE FUROATE-VILANTEROL 100-25 MCG/ACT IN AEPB: 1.0000 | INHALATION_SPRAY | Freq: Every day | RESPIRATORY_TRACT | Status: DC | PRN

## 2022-02-21 MED ORDER — RIVAROXABAN 10 MG PO TABS
10.0000 mg | ORAL_TABLET | Freq: Every day | ORAL | Status: DC
  Administered 2022-02-22: 10 mg via ORAL
  Filled 2022-02-21: qty 1

## 2022-02-21 MED ORDER — ORAL CARE MOUTH RINSE
15.0000 mL | Freq: Once | OROMUCOSAL | Status: AC
Start: 2022-02-21 — End: 2022-02-21

## 2022-02-21 MED ORDER — BUPIVACAINE IN DEXTROSE 0.75-8.25 % IT SOLN
INTRATHECAL | Status: DC | PRN
  Administered 2022-02-21: 1.8 mL via INTRATHECAL

## 2022-02-21 MED ORDER — DEXAMETHASONE SODIUM PHOSPHATE 10 MG/ML IJ SOLN: 8.0000 mg | Freq: Once | INTRAMUSCULAR | Status: DC

## 2022-02-21 MED ORDER — BISACODYL 10 MG RE SUPP: 10.0000 mg | Freq: Every day | RECTAL | Status: DC | PRN

## 2022-02-21 MED ORDER — PROPOFOL 1000 MG/100ML IV EMUL
INTRAVENOUS | Status: AC
  Filled 2022-02-21: qty 100

## 2022-02-21 MED ORDER — ONDANSETRON HCL 4 MG/2ML IJ SOLN: 4.0000 mg | Freq: Once | INTRAMUSCULAR | Status: DC | PRN

## 2022-02-21 MED ORDER — OXYCODONE HCL 5 MG PO TABS: 10.0000 mg | ORAL_TABLET | ORAL | Status: DC | PRN

## 2022-02-21 MED ORDER — FENTANYL CITRATE PF 50 MCG/ML IJ SOSY: 25.0000 ug | PREFILLED_SYRINGE | INTRAMUSCULAR | Status: DC | PRN

## 2022-02-21 MED ORDER — ACETAMINOPHEN 500 MG PO TABS
1000.0000 mg | ORAL_TABLET | Freq: Four times a day (QID) | ORAL | Status: AC
  Administered 2022-02-21 – 2022-02-22 (×4): 1000 mg via ORAL
  Filled 2022-02-21 (×4): qty 2

## 2022-02-21 MED ORDER — MIDAZOLAM HCL 2 MG/2ML IJ SOLN
1.0000 mg | INTRAMUSCULAR | Status: DC
  Filled 2022-02-21: qty 2

## 2022-02-21 MED ORDER — SODIUM CHLORIDE 0.9 % IV SOLN: INTRAVENOUS | Status: DC

## 2022-02-21 MED ORDER — STERILE WATER FOR IRRIGATION IR SOLN
Status: DC | PRN
  Administered 2022-02-21: 2000 mL

## 2022-02-21 MED ORDER — LACTATED RINGERS IV SOLN: INTRAVENOUS | Status: DC

## 2022-02-21 MED ORDER — METHOCARBAMOL 500 MG IVPB - SIMPLE MED: 500.0000 mg | Freq: Four times a day (QID) | INTRAVENOUS | Status: DC | PRN

## 2022-02-21 MED ORDER — LIDOCAINE 2% (20 MG/ML) 5 ML SYRINGE
INTRAMUSCULAR | Status: DC | PRN
  Administered 2022-02-21: 100 mg via INTRAVENOUS

## 2022-02-21 MED ORDER — PROPOFOL 10 MG/ML IV BOLUS
INTRAVENOUS | Status: AC
Start: 2022-02-21 — End: ?
  Filled 2022-02-21: qty 20

## 2022-02-21 MED ORDER — KETOROLAC TROMETHAMINE 30 MG/ML IJ SOLN
INTRAMUSCULAR | Status: DC | PRN
  Administered 2022-02-21: 30 mg

## 2022-02-21 MED ORDER — PREGABALIN 50 MG PO CAPS
50.0000 mg | ORAL_CAPSULE | Freq: Three times a day (TID) | ORAL | Status: DC
  Administered 2022-02-21 – 2022-02-22 (×3): 50 mg via ORAL
  Filled 2022-02-21 (×3): qty 1

## 2022-02-21 MED ORDER — MONTELUKAST SODIUM 10 MG PO TABS
10.0000 mg | ORAL_TABLET | Freq: Every day | ORAL | Status: DC
  Administered 2022-02-21: 10 mg via ORAL
  Filled 2022-02-21: qty 1

## 2022-02-21 MED ORDER — BUPIVACAINE-EPINEPHRINE (PF) 0.25% -1:200000 IJ SOLN
INTRAMUSCULAR | Status: AC
  Filled 2022-02-21: qty 30

## 2022-02-21 MED ORDER — HYDROMORPHONE HCL 1 MG/ML IJ SOLN: 0.5000 mg | INTRAMUSCULAR | Status: DC | PRN

## 2022-02-21 MED ORDER — METOPROLOL TARTRATE 25 MG PO TABS
25.0000 mg | ORAL_TABLET | Freq: Two times a day (BID) | ORAL | Status: DC
  Administered 2022-02-21: 25 mg via ORAL
  Filled 2022-02-21: qty 1

## 2022-02-21 MED ORDER — KETOROLAC TROMETHAMINE 15 MG/ML IJ SOLN
7.5000 mg | Freq: Four times a day (QID) | INTRAMUSCULAR | Status: AC
  Administered 2022-02-21 – 2022-02-22 (×4): 7.5 mg via INTRAVENOUS
  Filled 2022-02-21 (×4): qty 1

## 2022-02-21 MED ORDER — ATORVASTATIN CALCIUM 40 MG PO TABS
80.0000 mg | ORAL_TABLET | Freq: Every day | ORAL | Status: DC
  Administered 2022-02-21: 80 mg via ORAL
  Filled 2022-02-21: qty 2

## 2022-02-21 MED ORDER — ONDANSETRON HCL 4 MG PO TABS: 4.0000 mg | ORAL_TABLET | Freq: Four times a day (QID) | ORAL | Status: DC | PRN

## 2022-02-21 MED ORDER — METOCLOPRAMIDE HCL 5 MG/ML IJ SOLN: 5.0000 mg | Freq: Three times a day (TID) | INTRAMUSCULAR | Status: DC | PRN

## 2022-02-21 MED ORDER — FENTANYL CITRATE PF 50 MCG/ML IJ SOSY
50.0000 ug | PREFILLED_SYRINGE | INTRAMUSCULAR | Status: DC
  Administered 2022-02-21: 50 ug via INTRAVENOUS
  Filled 2022-02-21: qty 2

## 2022-02-21 MED ORDER — GLYCOPYRROLATE 0.2 MG/ML IJ SOLN
INTRAMUSCULAR | Status: DC | PRN
  Administered 2022-02-21: .2 mg via INTRAVENOUS

## 2022-02-21 MED ORDER — ISOPROPYL ALCOHOL 70 % SOLN
Status: DC | PRN
  Administered 2022-02-21: 1 via TOPICAL

## 2022-02-21 MED ORDER — DOCUSATE SODIUM 100 MG PO CAPS
100.0000 mg | ORAL_CAPSULE | Freq: Two times a day (BID) | ORAL | Status: DC
  Administered 2022-02-21 – 2022-02-22 (×2): 100 mg via ORAL
  Filled 2022-02-21 (×2): qty 1

## 2022-02-21 MED ORDER — ROPIVACAINE HCL 5 MG/ML IJ SOLN
INTRAMUSCULAR | Status: DC | PRN
  Administered 2022-02-21: 30 mL via PERINEURAL

## 2022-02-21 MED ORDER — PROPOFOL 500 MG/50ML IV EMUL
INTRAVENOUS | Status: DC | PRN
  Administered 2022-02-21: 50 ug/kg/min via INTRAVENOUS

## 2022-02-21 MED ORDER — LORATADINE 10 MG PO TABS
10.0000 mg | ORAL_TABLET | Freq: Every day | ORAL | Status: DC
  Administered 2022-02-22: 10 mg via ORAL
  Filled 2022-02-21: qty 1

## 2022-02-21 MED ORDER — SODIUM CHLORIDE (PF) 0.9 % IJ SOLN
INTRAMUSCULAR | Status: DC | PRN
  Administered 2022-02-21: 30 mL

## 2022-02-21 MED ORDER — CEFAZOLIN SODIUM-DEXTROSE 2-4 GM/100ML-% IV SOLN
2.0000 g | INTRAVENOUS | Status: AC
  Administered 2022-02-21: 2 g via INTRAVENOUS
  Filled 2022-02-21: qty 100

## 2022-02-21 MED ORDER — KETOROLAC TROMETHAMINE 30 MG/ML IJ SOLN
INTRAMUSCULAR | Status: AC
  Filled 2022-02-21: qty 1

## 2022-02-21 MED ORDER — DEXAMETHASONE SODIUM PHOSPHATE 10 MG/ML IJ SOLN
INTRAMUSCULAR | Status: DC | PRN
  Administered 2022-02-21: 10 mg via INTRAVENOUS

## 2022-02-21 MED ORDER — BUPIVACAINE-EPINEPHRINE 0.25% -1:200000 IJ SOLN
INTRAMUSCULAR | Status: DC | PRN
  Administered 2022-02-21: 30 mL

## 2022-02-21 MED ORDER — LIDOCAINE HCL (PF) 2 % IJ SOLN
INTRAMUSCULAR | Status: AC
  Filled 2022-02-21: qty 5

## 2022-02-21 MED ORDER — POVIDONE-IODINE 10 % EX SWAB
2.0000 | Freq: Once | CUTANEOUS | Status: AC
  Administered 2022-02-21: 2 via TOPICAL

## 2022-02-21 MED ORDER — ONDANSETRON HCL 4 MG/2ML IJ SOLN
INTRAMUSCULAR | Status: AC
  Filled 2022-02-21: qty 2

## 2022-02-21 MED ORDER — METOCLOPRAMIDE HCL 5 MG PO TABS: 5.0000 mg | ORAL_TABLET | Freq: Three times a day (TID) | ORAL | Status: DC | PRN

## 2022-02-21 MED ORDER — CEFAZOLIN SODIUM-DEXTROSE 2-4 GM/100ML-% IV SOLN
2.0000 g | Freq: Four times a day (QID) | INTRAVENOUS | Status: AC
  Administered 2022-02-21 – 2022-02-22 (×2): 2 g via INTRAVENOUS
  Filled 2022-02-21 (×2): qty 100

## 2022-02-21 MED ORDER — INSULIN ASPART 100 UNIT/ML IJ SOLN
0.0000 [IU] | Freq: Every day | INTRAMUSCULAR | Status: DC
  Administered 2022-02-21: 2 [IU] via SUBCUTANEOUS

## 2022-02-21 MED ORDER — SODIUM CHLORIDE 0.9 % IR SOLN
Status: DC | PRN
  Administered 2022-02-21: 3000 mL
  Administered 2022-02-21: 1000 mL

## 2022-02-21 MED ORDER — DEXAMETHASONE SODIUM PHOSPHATE 10 MG/ML IJ SOLN
INTRAMUSCULAR | Status: AC
Start: 2022-02-21 — End: ?
  Filled 2022-02-21: qty 1

## 2022-02-21 MED ORDER — OXYCODONE HCL 5 MG PO TABS: 5.0000 mg | ORAL_TABLET | Freq: Once | ORAL | Status: DC | PRN

## 2022-02-21 MED ORDER — METHOCARBAMOL 500 MG PO TABS
500.0000 mg | ORAL_TABLET | Freq: Four times a day (QID) | ORAL | Status: DC | PRN
  Administered 2022-02-22: 500 mg via ORAL
  Filled 2022-02-21: qty 1

## 2022-02-21 MED ORDER — SODIUM CHLORIDE (PF) 0.9 % IJ SOLN
INTRAMUSCULAR | Status: AC
  Filled 2022-02-21: qty 50

## 2022-02-21 MED ORDER — PHENYLEPHRINE HCL-NACL 20-0.9 MG/250ML-% IV SOLN
INTRAVENOUS | Status: DC | PRN
  Administered 2022-02-21: 20 ug/min via INTRAVENOUS

## 2022-02-21 MED ORDER — MENTHOL 3 MG MT LOZG
1.0000 | LOZENGE | OROMUCOSAL | Status: DC | PRN
  Administered 2022-02-21: 3 mg via ORAL
  Filled 2022-02-21: qty 9

## 2022-02-21 MED ORDER — OXYCODONE HCL 5 MG PO TABS
5.0000 mg | ORAL_TABLET | ORAL | Status: DC | PRN
  Administered 2022-02-21: 10 mg via ORAL
  Administered 2022-02-22: 5 mg via ORAL
  Filled 2022-02-21 (×2): qty 2

## 2022-02-21 MED ORDER — TRANEXAMIC ACID-NACL 1000-0.7 MG/100ML-% IV SOLN
1000.0000 mg | INTRAVENOUS | Status: AC
  Administered 2022-02-21: 1000 mg via INTRAVENOUS
  Filled 2022-02-21: qty 100

## 2022-02-21 MED ORDER — PHENYLEPHRINE HCL-NACL 20-0.9 MG/250ML-% IV SOLN
INTRAVENOUS | Status: AC
  Filled 2022-02-21: qty 250

## 2022-02-21 MED ORDER — POVIDONE-IODINE 10 % EX SWAB: 2.0000 | Freq: Once | CUTANEOUS | Status: DC

## 2022-02-21 MED ORDER — ONDANSETRON HCL 4 MG/2ML IJ SOLN: 4.0000 mg | Freq: Four times a day (QID) | INTRAMUSCULAR | Status: DC | PRN

## 2022-02-21 MED ORDER — ACETAMINOPHEN 500 MG PO TABS
1000.0000 mg | ORAL_TABLET | Freq: Once | ORAL | Status: AC
  Administered 2022-02-21: 1000 mg via ORAL
  Filled 2022-02-21: qty 2

## 2022-02-21 MED ORDER — INSULIN ASPART 100 UNIT/ML IJ SOLN
0.0000 [IU] | Freq: Three times a day (TID) | INTRAMUSCULAR | Status: DC
  Administered 2022-02-22 (×2): 1 [IU] via SUBCUTANEOUS

## 2022-02-21 MED ORDER — OXYCODONE HCL 5 MG/5ML PO SOLN: 5.0000 mg | Freq: Once | ORAL | Status: DC | PRN

## 2022-02-21 MED ORDER — ESCITALOPRAM OXALATE 20 MG PO TABS
20.0000 mg | ORAL_TABLET | Freq: Every day | ORAL | Status: DC
  Administered 2022-02-22: 20 mg via ORAL
  Filled 2022-02-21: qty 1

## 2022-02-21 MED ORDER — ALUM & MAG HYDROXIDE-SIMETH 200-200-20 MG/5ML PO SUSP: 30.0000 mL | ORAL | Status: DC | PRN

## 2022-02-21 MED ORDER — PROPOFOL 10 MG/ML IV BOLUS
INTRAVENOUS | Status: DC | PRN
  Administered 2022-02-21: 10 mg via INTRAVENOUS

## 2022-02-21 MED ORDER — ONDANSETRON HCL 4 MG/2ML IJ SOLN
INTRAMUSCULAR | Status: DC | PRN
  Administered 2022-02-21: 4 mg via INTRAVENOUS

## 2022-02-21 MED ORDER — PHENOL 1.4 % MT LIQD: 1.0000 | OROMUCOSAL | Status: DC | PRN

## 2022-02-21 SURGICAL SUPPLY — 70 items
BAG COUNTER SPONGE SURGICOUNT (BAG) ×1 IMPLANT
BAG ZIPLOCK 12X15 (MISCELLANEOUS) IMPLANT
BATTERY INSTRU NAVIGATION (MISCELLANEOUS) ×6 IMPLANT
BLADE SAW RECIPROCATING 77.5 (BLADE) ×2 IMPLANT
BNDG ELASTIC 4X5.8 VLCR STR LF (GAUZE/BANDAGES/DRESSINGS) ×2 IMPLANT
BNDG ELASTIC 6X5.8 VLCR STR LF (GAUZE/BANDAGES/DRESSINGS) ×2 IMPLANT
CHLORAPREP W/TINT 26 (MISCELLANEOUS) ×4 IMPLANT
COMP FEM KNEE PS NRW 10 RT (Joint) ×2 IMPLANT
COMP TIB KNEE PS G 0 RT (Joint) ×2 IMPLANT
COMPONENT FEM KN PS NRW 10 RT (Joint) IMPLANT
COMPONENT TIB KNEE PS G 0 RT (Joint) IMPLANT
COVER SURGICAL LIGHT HANDLE (MISCELLANEOUS) ×2 IMPLANT
DERMABOND ADVANCED (GAUZE/BANDAGES/DRESSINGS) ×1
DERMABOND ADVANCED .7 DNX12 (GAUZE/BANDAGES/DRESSINGS) ×2 IMPLANT
DRAPE INCISE IOBAN 66X45 STRL (DRAPES) ×2 IMPLANT
DRAPE SHEET LG 3/4 BI-LAMINATE (DRAPES) ×6 IMPLANT
DRAPE U-SHAPE 47X51 STRL (DRAPES) ×2 IMPLANT
DRSG AQUACEL AG ADV 3.5X10 (GAUZE/BANDAGES/DRESSINGS) ×1 IMPLANT
DRSG AQUACEL AG ADV 3.5X14 (GAUZE/BANDAGES/DRESSINGS) ×1 IMPLANT
ELECT BLADE TIP CTD 4 INCH (ELECTRODE) ×2 IMPLANT
ELECT REM PT RETURN 15FT ADLT (MISCELLANEOUS) ×2 IMPLANT
GAUZE SPONGE 4X4 12PLY STRL (GAUZE/BANDAGES/DRESSINGS) ×2 IMPLANT
GLOVE BIO SURGEON STRL SZ8.5 (GLOVE) ×4 IMPLANT
GLOVE BIOGEL M 7.0 STRL (GLOVE) ×2 IMPLANT
GLOVE BIOGEL PI IND STRL 7.5 (GLOVE) ×1 IMPLANT
GLOVE BIOGEL PI IND STRL 8 (GLOVE) ×2 IMPLANT
GLOVE BIOGEL PI IND STRL 8.5 (GLOVE) ×1 IMPLANT
GLOVE BIOGEL PI INDICATOR 7.5 (GLOVE) ×1
GLOVE BIOGEL PI INDICATOR 8 (GLOVE)
GLOVE BIOGEL PI INDICATOR 8.5 (GLOVE) ×1
GLOVE SURG LX 7.5 STRW (GLOVE)
GLOVE SURG LX STRL 7.5 STRW (GLOVE) ×3 IMPLANT
GOWN SPEC L3 XXLG W/TWL (GOWN DISPOSABLE) ×4 IMPLANT
HANDPIECE INTERPULSE COAX TIP (DISPOSABLE) ×1
HOLDER FOLEY CATH W/STRAP (MISCELLANEOUS) ×2 IMPLANT
HOOD PEEL AWAY FLYTE STAYCOOL (MISCELLANEOUS) ×6 IMPLANT
IMPL PATELLA METAL SZ32X10 (Joint) ×1 IMPLANT
INSERT TIB PS GH/7-12 11 RT (Insert) ×1 IMPLANT
KIT TURNOVER KIT A (KITS) ×1 IMPLANT
MARKER SKIN DUAL TIP RULER LAB (MISCELLANEOUS) ×2 IMPLANT
NDL SAFETY ECLIPSE 18X1.5 (NEEDLE) ×1 IMPLANT
NDL SPNL 18GX3.5 QUINCKE PK (NEEDLE) ×1 IMPLANT
NEEDLE HYPO 18GX1.5 SHARP (NEEDLE) ×1
NEEDLE SPNL 18GX3.5 QUINCKE PK (NEEDLE) ×2 IMPLANT
NS IRRIG 1000ML POUR BTL (IV SOLUTION) ×2 IMPLANT
PACK TOTAL KNEE CUSTOM (KITS) ×2 IMPLANT
PAD IMPACTOR FEM PS DISP (ORTHOPEDIC DISPOSABLE SUPPLIES) IMPLANT
PADDING CAST COTTON 6X4 STRL (CAST SUPPLIES) ×2 IMPLANT
PROTECTOR NERVE ULNAR (MISCELLANEOUS) ×2 IMPLANT
SAW OSC TIP CART 19.5X105X1.3 (SAW) ×2 IMPLANT
SEALER BIPOLAR AQUA 6.0 (INSTRUMENTS) ×2 IMPLANT
SET HNDPC FAN SPRY TIP SCT (DISPOSABLE) ×1 IMPLANT
SET PAD KNEE POSITIONER (MISCELLANEOUS) ×2 IMPLANT
SOLUTION PRONTOSAN WOUND 350ML (IRRIGATION / IRRIGATOR) ×1 IMPLANT
SPIKE FLUID TRANSFER (MISCELLANEOUS) ×4 IMPLANT
STAPLER VISISTAT 35W (STAPLE) ×1 IMPLANT
SUT MNCRL AB 3-0 PS2 18 (SUTURE) ×2 IMPLANT
SUT MNCRL AB 4-0 PS2 18 (SUTURE) ×2 IMPLANT
SUT MON AB 2-0 CT1 36 (SUTURE) ×2 IMPLANT
SUT STRATAFIX PDO 1 14 VIOLET (SUTURE) ×1
SUT STRATFX PDO 1 14 VIOLET (SUTURE) ×1
SUT VIC AB 1 CTX 36 (SUTURE) ×2
SUT VIC AB 1 CTX36XBRD ANBCTR (SUTURE) ×2 IMPLANT
SUT VIC AB 2-0 CT1 27 (SUTURE) ×1
SUT VIC AB 2-0 CT1 TAPERPNT 27 (SUTURE) ×1 IMPLANT
SUTURE STRATFX PDO 1 14 VIOLET (SUTURE) ×1 IMPLANT
TRAY FOLEY MTR SLVR 16FR STAT (SET/KITS/TRAYS/PACK) ×1 IMPLANT
TUBE SUCTION HIGH CAP CLEAR NV (SUCTIONS) ×2 IMPLANT
WATER STERILE IRR 1000ML POUR (IV SOLUTION) ×4 IMPLANT
WRAP KNEE MAXI GEL POST OP (GAUZE/BANDAGES/DRESSINGS) ×1 IMPLANT

## 2022-02-21 NOTE — Discharge Instructions (Addendum)
 Dr. Brian Swinteck Total Joint Specialist [facility] [address] [place], [place] [address] [phone]  TOTAL KNEE REPLACEMENT POSTOPERATIVE DIRECTIONS    Knee Rehabilitation, Guidelines Following Surgery  Results after knee surgery are often greatly improved when you follow the exercise, range of motion and muscle strengthening exercises prescribed by your doctor. Safety measures are also important to protect the knee from further injury. Any time any of these exercises cause you to have increased pain or swelling in your knee joint, decrease the amount until you are comfortable again and slowly increase them. If you have problems or questions, call your caregiver or physical therapist for advice.   WEIGHT BEARING Weight bearing as tolerated with assist device (walker, cane, etc) as directed, use it as long as suggested by your surgeon or therapist, typically at least 4-6 weeks.  HOME CARE INSTRUCTIONS  Remove items at home which could result in a fall. This includes throw rugs or furniture in walking pathways.  Continue medications as instructed at time of discharge. You may have some home medications which will be placed on hold until you complete the course of blood thinner medication.  You may start showering once you are discharged home but do not submerge the incision under water. Just pat the incision dry and apply a dry gauze dressing on daily. Walk with walker as instructed.  You may resume a sexual relationship in one month or when given the OK by your doctor.  Use walker as long as suggested by your caregivers. Avoid periods of inactivity such as sitting longer than an hour when not asleep. This helps prevent blood clots.  You may put full weight on your legs and walk as much as is comfortable.  You may return to work once you are cleared by your doctor.  Do not drive a car for 6 weeks or until released by you surgeon.  Do not drive while  taking narcotics.  Wear the elastic stockings for three weeks following surgery during the day but you may remove then at night. Make sure you keep all of your appointments after your operation with all of your doctors and caregivers. You should call the office at the above phone number and make an appointment for approximately two weeks after the date of your surgery. Do not remove your surgical dressing. The dressing is waterproof; you may take showers in 3 days, but do not take tub baths or submerge the dressing. Please pick up a stool softener and laxative for home use as long as you are requiring pain medications. ICE to the affected knee every three hours for 30 minutes at a time and then as needed for pain and swelling.  Continue to use ice on the knee for pain and swelling from surgery. You may notice swelling that will progress down to the foot and ankle.  This is normal after surgery.  Elevate the leg when you are not up walking on it.   It is important for you to complete the blood thinner medication as prescribed by your doctor. Continue to use the breathing machine which will help keep your temperature down.  It is common for your temperature to cycle up and down following surgery, especially at night when you are not up moving around and exerting yourself.  The breathing machine keeps your lungs expanded and your temperature down.  RANGE OF MOTION AND STRENGTHENING EXERCISES  Rehabilitation of the knee is important following a knee injury or an   operation. After just a few days of immobilization, the muscles of the thigh which control the knee become weakened and shrink (atrophy). Knee exercises are designed to build up the tone and strength of the thigh muscles and to improve knee motion. Often times heat used for twenty to thirty minutes before working out will loosen up your tissues and help with improving the range of motion but do not use heat for the first two weeks following surgery.  These exercises can be done on a training (exercise) mat, on the floor, on a table or on a bed. Use what ever works the best and is most comfortable for you Knee exercises include:  Leg Lifts - While your knee is still immobilized in a splint or cast, you can do straight leg raises. Lift the leg to 60 degrees, hold for 3 sec, and slowly lower the leg. Repeat 10-20 times 2-3 times daily. Perform this exercise against resistance later as your knee gets better.  Quad and Hamstring Sets - Tighten up the muscle on the front of the thigh (Quad) and hold for 5-10 sec. Repeat this 10-20 times hourly. Hamstring sets are done by pushing the foot backward against an object and holding for 5-10 sec. Repeat as with quad sets.  A rehabilitation program following serious knee injuries can speed recovery and prevent re-injury in the future due to weakened muscles. Contact your doctor or a physical therapist for more information on knee rehabilitation.   POST-OPERATIVE OPIOID TAPER INSTRUCTIONS: It is important to wean off of your opioid medication as soon as possible. If you do not need pain medication after your surgery it is ok to stop day one. Opioids include: Codeine, Hydrocodone(Norco, Vicodin), Oxycodone(Percocet, oxycontin) and hydromorphone amongst others.  Long term and even short term use of opiods can cause: Increased pain response Dependence Constipation Depression Respiratory depression And more.  Withdrawal symptoms can include Flu like symptoms Nausea, vomiting And more Techniques to manage these symptoms Hydrate well Eat regular healthy meals Stay active Use relaxation techniques(deep breathing, meditating, yoga) Do Not substitute Alcohol to help with tapering If you have been on opioids for less than two weeks and do not have pain than it is ok to stop all together.  Plan to wean off of opioids This plan should start within one week post op of your joint replacement. Maintain the same  interval or time between taking each dose and first decrease the dose.  Cut the total daily intake of opioids by one tablet each day Next start to increase the time between doses. The last dose that should be eliminated is the evening dose.    SKILLED REHAB INSTRUCTIONS: If the patient is transferred to a skilled rehab facility following release from the hospital, a list of the current medications will be sent to the facility for the patient to continue.  When discharged from the skilled rehab facility, please have the facility set up the patient's Home Health Physical Therapy prior to being released. Also, the skilled facility will be responsible for providing the patient with their medications at time of release from the facility to include their pain medication, the muscle relaxants, and their blood thinner medication. If the patient is still at the rehab facility at time of the two week follow up appointment, the skilled rehab facility will also need to assist the patient in arranging follow up appointment in our office and any transportation needs.  MAKE SURE YOU:  Understand these instructions.  Will watch   your condition.  Will get help right away if you are not doing well or get worse.    Pick up stool softner and laxative for home use following surgery while on pain medications. Do NOT remove your dressing. You may shower.  Do not take tub baths or submerge incision under water. May shower starting three days after surgery. Please use a clean towel to pat the incision dry following showers. Continue to use ice for pain and swelling after surgery. Do not use any lotions or creams on the incision until instructed by your surgeon. _________________________________________________________\  Information on my medicine - XARELTO (Rivaroxaban)  This medication education was reviewed with me or my healthcare representative as part of my discharge preparation.   Why was Xarelto prescribed  for you? Xarelto was prescribed for you to reduce the risk of blood clots forming after orthopedic surgery. The medical term for these abnormal blood clots is venous thromboembolism (VTE).  What do you need to know about xarelto ? Take your Xarelto ONCE DAILY at the same time every day. You may take it either with or without food.  If you have difficulty swallowing the tablet whole, you may crush it and mix in applesauce just prior to taking your dose.  Take Xarelto exactly as prescribed by your doctor and DO NOT stop taking Xarelto without talking to the doctor who prescribed the medication.  Stopping without other VTE prevention medication to take the place of Xarelto may increase your risk of developing a clot.  After discharge, you should have regular check-up appointments with your healthcare provider that is prescribing your Xarelto.    What do you do if you miss a dose? If you miss a dose, take it as soon as you remember on the same day then continue your regularly scheduled once daily regimen the next day. Do not take two doses of Xarelto on the same day.   Important Safety Information A possible side effect of Xarelto is bleeding. You should call your healthcare provider right away if you experience any of the following: Bleeding from an injury or your nose that does not stop. Unusual colored urine (red or dark brown) or unusual colored stools (red or black). Unusual bruising for unknown reasons. A serious fall or if you hit your head (even if there is no bleeding).  Some medicines may interact with Xarelto and might increase your risk of bleeding while on Xarelto. To help avoid this, consult your healthcare provider or pharmacist prior to using any new prescription or non-prescription medications, including herbals, vitamins, non-steroidal anti-inflammatory drugs (NSAIDs) and supplements.  This website has more information on Xarelto: VisitDestination.com.br.  I

## 2022-02-21 NOTE — Op Note (Signed)
OPERATIVE REPORT  SURGEON: Rod Can, MD   ASSISTANT: Larene Pickett, PA-C  PREOPERATIVE DIAGNOSIS: Primary Right knee arthritis.   POSTOPERATIVE DIAGNOSIS: Primary Right knee arthritis.   PROCEDURE: Computer assisted Right total knee arthroplasty.   IMPLANTS: Zimmer Persona PPS Cementless CR femur, size 10 Narrow. Persona 0 degree Spiked Keel OsseoTi Tibia, size G. Fixed bearing polyethelyene insert, size 11 mm, CR. TM standard patella, size 32 mm.  ANESTHESIA:  MAC, Regional, and Spinal  TOURNIQUET TIME: Not utilized.   ESTIMATED BLOOD LOSS:-300 mL    ANTIBIOTICS: 2 g Ancef.  DRAINS: None.  COMPLICATIONS: None   CONDITION: PACU - hemodynamically stable.   BRIEF CLINICAL NOTE: Sharon Glass is a 76 y.o. female with a long-standing history of Right knee arthritis. After failing conservative management, the patient was indicated for total knee arthroplasty. The risks, benefits, and alternatives to the procedure were explained, and the patient elected to proceed.  PROCEDURE IN DETAIL: Adductor canal block was obtained in the pre-op holding area. Once inside the operative room, spinal anesthesia was obtained, and a foley catheter was inserted. The patient was then positioned and the lower extremity was prepped and draped in the normal sterile surgical fashion.  A time-out was called verifying side and site of surgery. The patient received IV antibiotics within 60 minutes of beginning the procedure. A tourniquet was not utilized.   An anterior approach to the knee was performed utilizing a midvastus arthrotomy. A medial release was performed and the patellar fat pad was excised. Stryker imageless navigation was used to cut the distal femur perpendicular to the mechanical axis. A freehand patellar resection was performed, and the patella was sized an prepared with 3 lug holes.  Nagivation was used to make a neutral proximal tibia resection, taking 2 mm of bone from the less affected  medial side with 3 degrees of slope. The menisci were excised. A spacer block was placed, and the alignment and balance in extension were confirmed.   The distal femur was sized using the 3-degree external rotation guide referencing the posterior femoral cortex. The appropriate 4-in-1 cutting block was pinned into place. Rotation was checked using Whiteside's line, the epicondylar axis, and then confirmed with a spacer block in flexion. The remaining femoral cuts were performed, taking care to protect the MCL.  The tibia was sized and the trial tray was pinned into place. The remaining trail components were inserted. The knee was stable to varus and valgus stress through a full range of motion. The patella tracked centrally, and the PCL was well balanced. The trial components were removed, and the proximal tibial surface was prepared. Final components were impacted into place. The knee was tested for a final time and found to be well balanced.   The wound was copiously irrigated with Irrisept solution and normal saline using pule lavage.  Marcaine solution was injected into the periarticular soft tissue.  The wound was closed in layers using #1 Vicryl and Stratafix for the fascia, 2-0 Vicryl for the subcutaneous fat, 2-0 Monocryl for the deep dermal layer, and skin staples. Dermabond was applied to the skin.  Once the glue was fully dried, an Aquacell Ag and compressive dressing were applied.  The patient was transported to the recovery room in stable condition.  Sponge, needle, and instrument counts were correct at the end of the case x2.  The patient tolerated the procedure well and there were no known complications.  Please note that a surgical assistant was a medical necessity  for this procedure in order to perform it in a safe and expeditious manner. Surgical assistant was necessary to retract the ligaments and vital neurovascular structures to prevent injury to them and also necessary for proper  positioning of the limb to allow for anatomic placement of the prosthesis.

## 2022-02-21 NOTE — Anesthesia Procedure Notes (Signed)
Spinal  Patient location during procedure: OR Start time: 02/21/2022 12:40 PM End time: 02/21/2022 12:44 PM Reason for block: surgical anesthesia Staffing Performed: anesthesiologist  Anesthesiologist: Mal Amabile, MD Performed by: Mal Amabile, MD Authorized by: Mal Amabile, MD   Preanesthetic Checklist Completed: patient identified, IV checked, site marked, risks and benefits discussed, surgical consent, monitors and equipment checked, pre-op evaluation and timeout performed Spinal Block Patient position: sitting Prep: DuraPrep and site prepped and draped Patient monitoring: heart rate, cardiac monitor, continuous pulse ox and blood pressure Approach: midline Location: L3-4 Injection technique: single-shot Needle Needle type: Pencan  Needle gauge: 24 G Needle length: 9 cm Needle insertion depth: 7 cm Assessment Sensory level: T4 Events: CSF return Additional Notes Patient tolerated procedure well. Adequate sensory level.

## 2022-02-21 NOTE — Plan of Care (Signed)
  Problem: Education: Goal: Ability to describe self-care measures that may prevent or decrease complications (Diabetes Survival Skills Education) will improve Outcome: Progressing   Problem: Education: Goal: Knowledge of General Education information will improve Description: Including pain rating scale, medication(s)/side effects and non-pharmacologic comfort measures Outcome: Progressing   Problem: Activity: Goal: Risk for activity intolerance will decrease Outcome: Progressing   

## 2022-02-21 NOTE — Anesthesia Preprocedure Evaluation (Addendum)
Anesthesia Evaluation  Patient identified by MRN, date of birth, ID band Patient awake    Reviewed: Allergy & Precautions, NPO status , Patient's Chart, lab work & pertinent test results, reviewed documented beta blocker date and time   Airway Mallampati: II  TM Distance: >3 FB Neck ROM: Full    Dental  (+) Partial Upper, Partial Lower, Dental Advisory Given   Pulmonary asthma , sleep apnea and Continuous Positive Airway Pressure Ventilation , PE Hx/o PTE   Pulmonary exam normal breath sounds clear to auscultation       Cardiovascular hypertension, Pt. on medications and Pt. on home beta blockers Normal cardiovascular exam Rhythm:Regular Rate:Normal  EKG 02/12/22 NSR with sinus arrhythmia, LVH   Neuro/Psych PSYCHIATRIC DISORDERS Depression Peripheral neuropathy TIA Neuromuscular disease    GI/Hepatic Neg liver ROS, hiatal hernia, GERD  Medicated,  Endo/Other  diabetes, Well Controlled, Type 2, Oral Hypoglycemic AgentsObesity Hyperlipidemia  Renal/GU negative Renal ROS  negative genitourinary   Musculoskeletal  (+) Arthritis , Osteoarthritis,  OA right knee   Abdominal (+) + obese,   Peds  Hematology  (+) Blood dyscrasia, anemia ,   Anesthesia Other Findings   Reproductive/Obstetrics                           Anesthesia Physical Anesthesia Plan  ASA: 3  Anesthesia Plan: Spinal   Post-op Pain Management: Regional block* and Minimal or no pain anticipated   Induction:   PONV Risk Score and Plan: Treatment may vary due to age or medical condition, Propofol infusion and Ondansetron  Airway Management Planned: Natural Airway, Simple Face Mask and Nasal Cannula  Additional Equipment: None  Intra-op Plan:   Post-operative Plan:   Informed Consent: I have reviewed the patients History and Physical, chart, labs and discussed the procedure including the risks, benefits and alternatives for  the proposed anesthesia with the patient or authorized representative who has indicated his/her understanding and acceptance.     Dental advisory given  Plan Discussed with: CRNA and Anesthesiologist  Anesthesia Plan Comments:         Anesthesia Quick Evaluation

## 2022-02-21 NOTE — Anesthesia Procedure Notes (Signed)
Anesthesia Regional Block: Adductor canal block   Pre-Anesthetic Checklist: , timeout performed,  Correct Patient, Correct Site, Correct Laterality,  Correct Procedure, Correct Position, site marked,  Risks and benefits discussed,  Surgical consent,  Pre-op evaluation,  At surgeon's request and post-op pain management  Laterality: Right  Prep: chloraprep       Needles:  Injection technique: Single-shot  Needle Type: Echogenic Stimulator Needle     Needle Length: 10cm  Needle Gauge: 21   Needle insertion depth: 8 cm   Additional Needles:   Procedures:,,,, ultrasound used (permanent image in chart),,    Narrative:  Start time: 02/21/2022 11:03 AM End time: 02/21/2022 11:08 AM Injection made incrementally with aspirations every 5 mL.  Performed by: Personally  Anesthesiologist: Mal Amabile, MD  Additional Notes: Timeout performed. Patient sedated. Relevant anatomy ID'd using [place]. Incremental 2-57ml injection of LA with frequent aspiration. Patient tolerated procedure well.     Right Adductor Canal Block

## 2022-02-21 NOTE — Anesthesia Postprocedure Evaluation (Signed)
Anesthesia Post Note  Patient: Sharon Glass  Procedure(s) Performed: COMPUTER ASSISTED TOTAL KNEE ARTHROPLASTY (Right: Knee)     Patient location during evaluation: PACU Anesthesia Type: Spinal Level of consciousness: awake and alert and oriented Pain management: pain level controlled Vital Signs Assessment: post-procedure vital signs reviewed and stable Respiratory status: spontaneous breathing, nonlabored ventilation and respiratory function stable Cardiovascular status: stable Postop Assessment: no headache, no backache, spinal receding and patient able to bend at knees Anesthetic complications: no   No notable events documented.  Last Vitals:  Vitals:   02/21/22 1615 02/21/22 1636  BP: (!) 149/75 137/75  Pulse: (!) 52 (!) 48  Resp: 13 17  Temp: 36.4 C (!) 36.4 C  SpO2: 100% 95%    Last Pain:  Vitals:   02/21/22 1636  TempSrc: Oral  PainSc:                  Keiron Iodice A.

## 2022-02-21 NOTE — Interval H&P Note (Signed)
History and Physical Interval Note:  02/21/2022 11:15 AM  Sharon Glass  has presented today for surgery, with the diagnosis of Right knee osteoarthritits.  The various methods of treatment have been discussed with the patient and family. After consideration of risks, benefits and other options for treatment, the patient has consented to  Procedure(s): COMPUTER ASSISTED TOTAL KNEE ARTHROPLASTY (Right) as a surgical intervention.  The patient's history has been reviewed, patient examined, no change in status, stable for surgery.  I have reviewed the patient's chart and labs.  Questions were answered to the patient's satisfaction.     Iline Oven Elgene Coral

## 2022-02-21 NOTE — Transfer of Care (Signed)
Immediate Anesthesia Transfer of Care Note  Patient: Sharon Glass  Procedure(s) Performed: COMPUTER ASSISTED TOTAL KNEE ARTHROPLASTY (Right: Knee)  Patient Location: PACU  Anesthesia Type:Spinal  Level of Consciousness: drowsy  Airway & Oxygen Therapy: Patient Spontanous Breathing and Patient connected to face mask oxygen  Post-op Assessment: Report given to RN and Post -op Vital signs reviewed and stable  Post vital signs: Reviewed and stable  Last Vitals:  Vitals Value Taken Time  BP    Temp    Pulse 56 02/21/22 1525  Resp 16 02/21/22 1525  SpO2 100 % 02/21/22 1525  Vitals shown include unvalidated device data.  Last Pain:  Vitals:   02/21/22 0922  TempSrc:   PainSc: 0-No pain      Patients Stated Pain Goal: 5 (02/21/22 3016)  Complications: No notable events documented.

## 2022-02-21 NOTE — Anesthesia Procedure Notes (Signed)
Date/Time: 02/21/2022 12:38 PM  Performed by: Florene Route, CRNAOxygen Delivery Method: Simple face mask

## 2022-02-21 NOTE — Progress Notes (Signed)
Assisted Dr. Foster with right, adductor canal, ultrasound guided block. Side rails up, monitors on throughout procedure. See vital signs in flow sheet. Tolerated Procedure well.  

## 2022-02-22 ENCOUNTER — Encounter (HOSPITAL_COMMUNITY): Payer: Self-pay | Admitting: Orthopedic Surgery

## 2022-02-22 DIAGNOSIS — M1711 Unilateral primary osteoarthritis, right knee: Secondary | ICD-10-CM | POA: Diagnosis not present

## 2022-02-22 LAB — BASIC METABOLIC PANEL
Anion gap: 5 (ref 5–15)
BUN: 17 mg/dL (ref 8–23)
CO2: 28 mmol/L (ref 22–32)
Calcium: 8.6 mg/dL — ABNORMAL LOW (ref 8.9–10.3)
Chloride: 111 mmol/L (ref 98–111)
Creatinine, Ser: 0.98 mg/dL (ref 0.44–1.00)
GFR, Estimated: 59 mL/min — ABNORMAL LOW (ref >60)
Glucose, Bld: 171 mg/dL — ABNORMAL HIGH (ref 70–99)
Potassium: 4.6 mmol/L (ref 3.5–5.1)
Sodium: 144 mmol/L (ref 135–145)

## 2022-02-22 LAB — CBC
HCT: 33.9 % — ABNORMAL LOW (ref 36.0–46.0)
Hemoglobin: 10.3 g/dL — ABNORMAL LOW (ref 12.0–15.0)
MCH: 26.7 pg (ref 26.0–34.0)
MCHC: 30.4 g/dL (ref 30.0–36.0)
MCV: 87.8 fL (ref 80.0–100.0)
Platelets: 260 10*3/uL (ref 150–400)
RBC: 3.86 MIL/uL — ABNORMAL LOW (ref 3.87–5.11)
RDW: 17 % — ABNORMAL HIGH (ref 11.5–15.5)
WBC: 13.9 10*3/uL — ABNORMAL HIGH (ref 4.0–10.5)
nRBC: 0 % (ref 0.0–0.2)

## 2022-02-22 LAB — GLUCOSE, CAPILLARY
Glucose-Capillary: 145 mg/dL — ABNORMAL HIGH (ref 70–99)
Glucose-Capillary: 133 mg/dL — ABNORMAL HIGH (ref 70–99)

## 2022-02-22 MED ORDER — ONDANSETRON HCL 4 MG PO TABS: 4.0000 mg | ORAL_TABLET | Freq: Three times a day (TID) | ORAL | 0 refills | Status: AC | PRN

## 2022-02-22 MED ORDER — SENNA 8.6 MG PO TABS: 2.0000 | ORAL_TABLET | Freq: Every day | ORAL | 0 refills | Status: AC

## 2022-02-22 MED ORDER — RIVAROXABAN 10 MG PO TABS
10.0000 mg | ORAL_TABLET | Freq: Every day | ORAL | 1 refills | Status: AC
Start: 2022-02-22 — End: ?

## 2022-02-22 MED ORDER — METHOCARBAMOL 500 MG PO TABS: 500.0000 mg | ORAL_TABLET | Freq: Four times a day (QID) | ORAL | 0 refills | Status: AC | PRN

## 2022-02-22 MED ORDER — DOCUSATE SODIUM 100 MG PO CAPS
100.0000 mg | ORAL_CAPSULE | Freq: Two times a day (BID) | ORAL | 0 refills | Status: AC
Start: 2022-02-22 — End: 2022-03-24

## 2022-02-22 MED ORDER — POLYETHYLENE GLYCOL 3350 17 G PO PACK: 17.0000 g | PACK | Freq: Every day | ORAL | 0 refills | Status: AC | PRN

## 2022-02-22 MED ORDER — OXYCODONE HCL 5 MG PO TABS: 5.0000 mg | ORAL_TABLET | ORAL | 0 refills | Status: AC | PRN

## 2022-02-22 NOTE — Progress Notes (Signed)
Physical Therapy Treatment Patient Details Name: Sharon Glass MRN: [id number] DOB: Sep 21, 1945 Today's Date: 02/22/2022   History of Present Illness Pt s/p R TKR and with hx of DM, clotting disorder, TIA, L TKR and neuropathy bil feet    PT Comments    Pt continues very cooperative and with noted improvement in activity tolerance and ambulatory balance and with noted decrease in level of assist required for most activities.  Pt up to ambulate increased distance in hall, negotiated stair, and reviewed HEP with written instruction provided.  Pt and spouse both feeling comfortable with dc home this date.   Recommendations for follow up therapy are one component of a multi-disciplinary discharge planning process, led by the attending physician.  Recommendations may be updated based on patient status, additional functional criteria and insurance authorization.  Follow Up Recommendations  Follow physician's recommendations for discharge plan and follow up therapies     Assistance Recommended at Discharge None  Patient can return home with the following A little help with walking and/or transfers;A little help with bathing/dressing/bathroom;Assistance with cooking/housework;Assist for transportation;Help with stairs or ramp for entrance   Equipment Recommendations  Rolling walker (2 wheels)    Recommendations for Other Services       Precautions / Restrictions Precautions Precautions: Fall;Knee Restrictions Weight Bearing Restrictions: No Other Position/Activity Restrictions: WBAT     Mobility  Bed Mobility Overal bed mobility: Needs Assistance Bed Mobility: Supine to Sit     Supine to sit: Min assist, Min guard     General bed mobility comments: Up in recliner and requests back to same    Transfers Overall transfer level: Needs assistance Equipment used: Rolling walker (2 wheels) Transfers: Sit to/from Stand Sit to Stand: Min guard, Supervision           General  transfer comment: cues for LE management and use of UEs to self assist    Ambulation/Gait Ambulation/Gait assistance: Min guard Gait Distance (Feet): 150 Feet Assistive device: Rolling walker (2 wheels) Gait Pattern/deviations: Shuffle, Narrow base of support, Step-to pattern, Step-through pattern       General Gait Details: cues for sequence, posture and position from RW;   Stairs Stairs: Yes Stairs assistance: Min assist Stair Management: No rails, Step to pattern, Forwards, With walker Number of Stairs: 1 General stair comments: cues for sequence   Wheelchair Mobility    Modified Rankin (Stroke Patients Only)       Balance Overall balance assessment: Needs assistance Sitting-balance support: No upper extremity supported Sitting balance-Leahy Scale: Good     Standing balance support: Single extremity supported Standing balance-Leahy Scale: Poor                              Cognition Arousal/Alertness: Awake/alert Behavior During Therapy: WFL for tasks assessed/performed Overall Cognitive Status: Within Functional Limits for tasks assessed                                          Exercises Total Joint Exercises Ankle Circles/Pumps: AROM, Both, 15 reps, Supine Quad Sets: AROM, Both, 10 reps, Supine Heel Slides: AAROM, Right, Supine, 10 reps Straight Leg Raises: AAROM, AROM, Right, 10 reps, Supine Long Arc Quad: AROM, Right, 5 reps, Seated    General Comments        Pertinent Vitals/Pain Pain Assessment Pain Assessment: 0-10 Pain Score:  3  Pain Location: R knee Pain Descriptors / Indicators: Aching, Sore Pain Intervention(s): Limited activity within patient's tolerance, Monitored during session, Ice applied    Home Living Family/patient expects to be discharged to:: Private residence Living Arrangements: Spouse/significant other Available Help at Discharge: Family;Available 24 hours/day Type of Home: House Home Access:  Stairs to enter   [employer] of Steps: 1   Home Layout: One level Home Equipment: Cane - single point      Prior Function            PT Goals (current goals can now be found in the care plan section) Acute Rehab PT Goals Patient Stated Goal: HOME PT Goal Formulation: With patient Time For Goal Achievement: 03/01/22 Potential to Achieve Goals: Good Progress towards PT goals: Progressing toward goals    Frequency    7X/week      PT Plan Current plan remains appropriate    Co-evaluation              AM-PAC PT "6 Clicks" Mobility   Outcome Measure  Help needed turning from your back to your side while in a flat bed without using bedrails?: A Little Help needed moving from lying on your back to sitting on the side of a flat bed without using bedrails?: A Little Help needed moving to and from a bed to a chair (including a wheelchair)?: A Little Help needed standing up from a chair using your arms (e.g., wheelchair or bedside chair)?: A Little Help needed to walk in hospital room?: A Little Help needed climbing 3-5 steps with a railing? : A Little 6 Click Score: 18    End of Session Equipment Utilized During Treatment: Gait belt Activity Tolerance: Patient tolerated treatment well Patient left: in chair;with call bell/phone within reach;with chair alarm set;with family/visitor present Nurse Communication: Mobility status PT Visit Diagnosis: Unsteadiness on feet (R26.81);History of falling (Z91.81);Difficulty in walking, not elsewhere classified (R26.2);Dizziness and giddiness (R42)     Time: 9509-3267 PT Time Calculation (min) (ACUTE ONLY): 40 min  Charges:  $Gait Training: 23-37 mins $Therapeutic Exercise: 8-22 mins                     Sharon Glass PT Acute Rehabilitation Services Pager [phone] Office [phone]    Sharon Glass 02/22/2022, 2:40 PM

## 2022-02-22 NOTE — Evaluation (Signed)
Physical Therapy Evaluation Patient Details Name: Sharon Glass MRN: [id number] DOB: 25-May-1946 Today's Date: 02/22/2022  History of Present Illness  Pt s/p R TKR and with hx of DM, clotting disorder, TIA, L TKR and neuropathy bil feet  Clinical Impression  Pt s/p R TKR and presents with decreased R LE strength/ROM, post op pain, obesity, ambulatory balance deficits and premorbid deconditioning limiting functional mobility.  Pt should progress to dc home with family assist and reports first OP PT scheduled for 02/26/22.    Recommendations for follow up therapy are one component of a multi-disciplinary discharge planning process, led by the attending physician.  Recommendations may be updated based on patient status, additional functional criteria and insurance authorization.  Follow Up Recommendations Follow physician's recommendations for discharge plan and follow up therapies      Assistance Recommended at Discharge None  Patient can return home with the following  A little help with walking and/or transfers;A little help with bathing/dressing/bathroom;Assistance with cooking/housework;Assist for transportation;Help with stairs or ramp for entrance    Equipment Recommendations Rolling walker (2 wheels) (has been delivered to room)  Recommendations for Other Services       Functional Status Assessment Patient has had a recent decline in their functional status and demonstrates the ability to make significant improvements in function in a reasonable and predictable amount of time.     Precautions / Restrictions Precautions Precautions: Fall;Knee Restrictions Weight Bearing Restrictions: No Other Position/Activity Restrictions: WBAT      Mobility  Bed Mobility Overal bed mobility: Needs Assistance Bed Mobility: Supine to Sit     Supine to sit: Min assist, Min guard     General bed mobility comments: Increased time and use of bed rail    Transfers Overall transfer level:  Needs assistance Equipment used: Rolling walker (2 wheels) Transfers: Sit to/from Stand Sit to Stand: Min assist           General transfer comment: cues for LE management and use of UEs to self assist    Ambulation/Gait Ambulation/Gait assistance: Min assist Gait Distance (Feet): 68 Feet Assistive device: Rolling walker (2 wheels) Gait Pattern/deviations: Step-to pattern, Shuffle, Narrow base of support       General Gait Details: cues for sequence, posture and position from RW; physical assist required for RW management and for balance with noted instability throughout time on feet  Stairs            Wheelchair Mobility    Modified Rankin (Stroke Patients Only)       Balance Overall balance assessment: Needs assistance Sitting-balance support: No upper extremity supported Sitting balance-Leahy Scale: Good     Standing balance support: Single extremity supported Standing balance-Leahy Scale: Poor                               Pertinent Vitals/Pain Pain Assessment Pain Assessment: 0-10 Pain Score: 3  Pain Location: R knee Pain Descriptors / Indicators: Aching, Sore Pain Intervention(s): Limited activity within patient's tolerance, Monitored during session, Premedicated before session, Ice applied    Home Living Family/patient expects to be discharged to:: Private residence Living Arrangements: Spouse/significant other Available Help at Discharge: Family;Available 24 hours/day Type of Home: House Home Access: Stairs to enter   [employer] of Steps: 1   Home Layout: One level Home Equipment: Cane - single point      Prior Function Prior Level of Function : Independent/Modified Independent  Mobility Comments: But admits to having fallen       Hand Dominance        Extremity/Trunk Assessment   Upper Extremity Assessment Upper Extremity Assessment: Overall WFL for tasks assessed    Lower Extremity  Assessment Lower Extremity Assessment: RLE deficits/detail RLE Deficits / Details: 3/5 quads with AAROM at knee 0 - 80 RLE: Unable to fully assess due to pain    Cervical / Trunk Assessment Cervical / Trunk Assessment: Normal  Communication   Communication: No difficulties  Cognition Arousal/Alertness: Awake/alert Behavior During Therapy: WFL for tasks assessed/performed Overall Cognitive Status: Within Functional Limits for tasks assessed                                          General Comments      Exercises Total Joint Exercises Ankle Circles/Pumps: AROM, Both, 15 reps, Supine Quad Sets: AROM, Both, 10 reps, Supine Heel Slides: AAROM, Right, 15 reps, Supine Straight Leg Raises: AAROM, AROM, Right, 10 reps, Supine   Assessment/Plan    PT Assessment Patient needs continued PT services  PT Problem List Decreased strength;Decreased range of motion;Decreased activity tolerance;Decreased balance;Decreased mobility;Decreased knowledge of use of DME;Obesity;Pain       PT Treatment Interventions DME instruction;Gait training;Stair training;Functional mobility training;Therapeutic activities;Therapeutic exercise;Patient/family education    PT Goals (Current goals can be found in the Care Plan section)  Acute Rehab PT Goals Patient Stated Goal: HOME PT Goal Formulation: With patient Time For Goal Achievement: 03/01/22 Potential to Achieve Goals: Good    Frequency 7X/week     Co-evaluation               AM-PAC PT "6 Clicks" Mobility  Outcome Measure Help needed turning from your back to your side while in a flat bed without using bedrails?: A Little Help needed moving from lying on your back to sitting on the side of a flat bed without using bedrails?: A Little Help needed moving to and from a bed to a chair (including a wheelchair)?: A Little Help needed standing up from a chair using your arms (e.g., wheelchair or bedside chair)?: A Little Help  needed to walk in hospital room?: A Little Help needed climbing 3-5 steps with a railing? : A Lot 6 Click Score: 17    End of Session Equipment Utilized During Treatment: Gait belt Activity Tolerance: Patient tolerated treatment well Patient left: in chair;with call bell/phone within reach;with chair alarm set;with family/visitor present Nurse Communication: Mobility status PT Visit Diagnosis: Unsteadiness on feet (R26.81);History of falling (Z91.81);Difficulty in walking, not elsewhere classified (R26.2);Dizziness and giddiness (R42)    Time: 8756-4332 PT Time Calculation (min) (ACUTE ONLY): 36 min   Charges:   PT Evaluation $PT Eval Low Complexity: 1 Low PT Treatments $Therapeutic Exercise: 8-22 mins        Mauro Kaufmann PT Acute Rehabilitation Services Pager [phone] Office [phone]   Simrat Kendrick 02/22/2022, 12:10 PM

## 2022-02-22 NOTE — TOC Transition Note (Signed)
Transition of Care [facility]) - CM/SW Discharge Note   Patient Details  Name: Sharon Glass MRN: [id number] Date of Birth: 08/10/1946  Transition of Care [facility]) CM/SW Contact:  Lennart Pall, LCSW Phone Number: 02/22/2022, 9:33 AM   Clinical Narrative:     Met with pt and spouse and confirming need for rolling walker - no DME agency preference - order placed with [employer] for delivery to room.  Pt confirms OPPT already set up with [facility] in [place].  No further TOC needs.  Final next level of care: OP Rehab Barriers to Discharge: No Barriers Identified   Patient Goals and CMS Choice Patient states their goals for this hospitalization and ongoing recovery are:: return home      Discharge Placement                       Discharge Plan and Services                DME Arranged: Walker rolling DME Agency: [employer] Date DME Agency Contacted: 02/22/22 Time DME Agency Contacted: [phone] Representative spoke with at DME Agency: [place] Determinants of Health ([place]) Interventions     Readmission Risk Interventions     No data to display

## 2022-02-22 NOTE — Progress Notes (Addendum)
    Subjective:  Patient reports pain as mild to moderate.  Denies N/V/CP/SOB and tingling and numbness in LE bilaterally. She states she is not having much pain and did well through the night.   Objective:   VITALS:   Vitals:   02/21/22 1847 02/21/22 2125 02/22/22 0150 02/22/22 0616  BP: 140/79 136/74 124/70 140/72  Pulse: (!) 54 (!) 57 65 (!) 54  Resp: 17 16  18   Temp: 98 F (36.7 C) 97.7 F (36.5 C) 98.4 F (36.9 C) (!) 97.5 F (36.4 C)  TempSrc: Oral Oral Oral Oral  SpO2: 99% 95% 100% 97%  Weight:      Height:        Patient lying in bed with husband at bedside. She is wearing her CPAP. NAD.  Neurologically intact ABD soft Neurovascular intact Sensation intact distally Intact pulses distally Dorsiflexion/Plantar flexion intact Incision: dressing C/D/I No cellulitis present Compartment soft Nurse removed catheter this morning.   Lab Results  Component Value Date   WBC 13.9 (H) 02/22/2022   HGB 10.3 (L) 02/22/2022   HCT 33.9 (L) 02/22/2022   MCV 87.8 02/22/2022   PLT 260 02/22/2022   BMET    Component Value Date/Time   NA 144 02/22/2022 0315   K 4.6 02/22/2022 0315   CL 111 02/22/2022 0315   CO2 28 02/22/2022 0315   GLUCOSE 171 (H) 02/22/2022 0315   BUN 17 02/22/2022 0315   CREATININE 0.98 02/22/2022 0315   CALCIUM 8.6 (L) 02/22/2022 0315   GFRNONAA 59 (L) 02/22/2022 0315     Assessment/Plan: 1 Day Post-Op   Principal Problem:   Osteoarthritis of right knee Active Problems:   S/P total knee arthroplasty, right   WBAT with walker DVT ppx: Xarelto, SCDs, TEDS PO pain control PT/OT: PT has not seen yet, will come by today.  Dispo: D/c home with OPPT once cleared with PT and once she has voided on her own. Nurse to remove ace wrap and put on TED stocking at discharge.     02/24/2022, PA-C 02/22/2022, 7:09 AM  [facility]  [place] [address], [place], [place] Phone:  [phone] [email or web address] Facebook  [phone]

## 2022-02-22 NOTE — Plan of Care (Signed)
Plan of care reviewed with the patient. 

## 2022-02-23 NOTE — Discharge Summary (Signed)
Physician Discharge Summary  Patient ID: Sharon Glass MRN: [id number] DOB/AGE: Jan 14, 1946 76 y.o.  Admit date: 02/21/2022 Discharge date: 02/22/2022  Admission Diagnoses:  Osteoarthritis of right knee  Discharge Diagnoses:  Principal Problem:   Osteoarthritis of right knee Active Problems:   S/P total knee arthroplasty, right   Past Medical History:  Diagnosis Date   Anemia    Arthritis    Asthma    Cellulitis    Clotting disorder (HCC)    Depression    Diabetes mellitus without complication (HCC)    DVT (deep venous thrombosis) (HCC)    GERD (gastroesophageal reflux disease)    History of hiatal hernia    Hyperlipidemia    Hypertension    Nervous breakdown    Neuromuscular disorder (HCC)    neuropathy feet   Pain in limb    Pulmonary embolism (HCC)    Sleep apnea    wears CPAP   TIA (transient ischemic attack)    Venous insufficiency     Surgeries: Procedure(s): COMPUTER ASSISTED TOTAL KNEE ARTHROPLASTY on 02/21/2022   Consultants (if any):   Discharged Condition: Improved  Hospital Course: Sharon Glass is an 76 y.o. female who was admitted 02/21/2022 with a diagnosis of Osteoarthritis of right knee and went to the operating room on 02/21/2022 and underwent the above named procedures.    She was given perioperative antibiotics:  Anti-infectives (From admission, onward)    Start     Dose/Rate Route Frequency Ordered Stop   02/22/22 1000  cephALEXin (KEFLEX) capsule 250 mg  Status:  Discontinued        250 mg Oral Daily 02/21/22 1646 02/22/22 2107   02/21/22 1800  ceFAZolin (ANCEF) IVPB 2g/100 mL premix        2 g 200 mL/hr over 30 Minutes Intravenous Every 6 hours 02/21/22 1646 02/22/22 0037   02/21/22 0900  ceFAZolin (ANCEF) IVPB 2g/100 mL premix        2 g 200 mL/hr over 30 Minutes Intravenous On call to O.R. 02/21/22 0858 02/21/22 1252       She was given sequential compression devices, early ambulation, and xarelto for DVT prophylaxis.  She was  cleared by PT after ambulating 150 feet.   She benefited maximally from the hospital stay and there were no complications.    Recent vital signs:  Vitals:   02/22/22 1010 02/22/22 1402  BP: 118/66 113/68  Pulse: (!) 52 (!) 49  Resp: 18   Temp: (!) 97.5 F (36.4 C) 98 F (36.7 C)  SpO2: 99% 100%    Recent laboratory studies:  Lab Results  Component Value Date   HGB 10.3 (L) 02/22/2022   HGB 11.3 (L) 02/19/2022   HGB 13.3 02/12/2022   Lab Results  Component Value Date   WBC 13.9 (H) 02/22/2022   PLT 260 02/22/2022   Lab Results  Component Value Date   INR 2.9 (H) 04/21/2009   Lab Results  Component Value Date   NA 144 02/22/2022   K 4.6 02/22/2022   CL 111 02/22/2022   CO2 28 02/22/2022   BUN 17 02/22/2022   CREATININE 0.98 02/22/2022   GLUCOSE 171 (H) 02/22/2022     Allergies as of 02/22/2022   No Known Allergies      Medication List     TAKE these medications    amLODipine 10 MG tablet Commonly known as: NORVASC Take 10 mg by mouth daily.   atorvastatin 80 MG tablet Commonly known as: LIPITOR Take 80  mg by mouth at bedtime.   B-12 2500 MCG Tabs Take 2,500 mcg by mouth daily.   Breo Ellipta 100-25 MCG/ACT Aepb Generic drug: fluticasone furoate-vilanterol Inhale 1 puff into the lungs daily as needed (asthma).   cephALEXin 250 MG capsule Commonly known as: KEFLEX Take 250 mg by mouth daily. 90 day supply. Started 01/19/22   cetirizine 10 MG tablet Commonly known as: ZYRTEC Take 10 mg by mouth daily.   docusate sodium 100 MG capsule Commonly known as: Colace Take 1 capsule (100 mg total) by mouth 2 (two) times daily.   donepezil 5 MG tablet Commonly known as: ARICEPT Take 5 mg by mouth at bedtime.   escitalopram 20 MG tablet Commonly known as: LEXAPRO Take 20 mg by mouth daily.   Evening Primrose Oil 500 MG Caps Take 500 mg by mouth in the morning, at noon, and at bedtime.   famotidine 20 MG tablet Commonly known as: PEPCID Take  20 mg by mouth 2 (two) times daily.   ferrous sulfate 325 (65 FE) MG tablet Take 325 mg by mouth daily with breakfast.   irbesartan 300 MG tablet Commonly known as: AVAPRO Take 300 mg by mouth daily.   memantine 10 MG tablet Commonly known as: NAMENDA Take 10 mg by mouth 2 (two) times daily.   metFORMIN 500 MG tablet Commonly known as: GLUCOPHAGE Take 500 mg by mouth daily with breakfast.   methocarbamol 500 MG tablet Commonly known as: ROBAXIN Take 1 tablet (500 mg total) by mouth every 6 (six) hours as needed for muscle spasms.   metoprolol tartrate 25 MG tablet Commonly known as: LOPRESSOR Take 25 mg by mouth 2 (two) times daily.   montelukast 10 MG tablet Commonly known as: SINGULAIR Take 10 mg by mouth at bedtime.   ondansetron 4 MG tablet Commonly known as: Zofran Take 1 tablet (4 mg total) by mouth every 8 (eight) hours as needed for nausea or vomiting.   oxyCODONE 5 MG immediate release tablet Commonly known as: Roxicodone Take 1 tablet (5 mg total) by mouth every 4 (four) hours as needed for up to 7 days for severe pain or moderate pain.   polyethylene glycol 17 g packet Commonly known as: MiraLax Take 17 g by mouth daily as needed for mild constipation or moderate constipation.   pregabalin 50 MG capsule Commonly known as: LYRICA Take 50 mg by mouth 3 (three) times daily.   rivaroxaban 10 MG Tabs tablet Commonly known as: XARELTO Take 1 tablet (10 mg total) by mouth daily with breakfast.   senna 8.6 MG Tabs tablet Commonly known as: SENOKOT Take 2 tablets (17.2 mg total) by mouth at bedtime for 15 days.   vitamin E 180 MG (400 UNITS) capsule Take 400 Units by mouth daily.               Discharge Care Instructions  (From admission, onward)           Start     Ordered   02/22/22 0000  Weight bearing as tolerated        02/22/22 0717   02/22/22 0000  Change dressing       Comments: Do not remove your dressing.   02/22/22 0717               WEIGHT BEARING   Weight bearing as tolerated with assist device (walker, cane, etc) as directed, use it as long as suggested by your surgeon or therapist, typically at least 4-6 weeks.  EXERCISES  Results after joint replacement surgery are often greatly improved when you follow the exercise, range of motion and muscle strengthening exercises prescribed by your doctor. Safety measures are also important to protect the joint from further injury. Any time any of these exercises cause you to have increased pain or swelling, decrease what you are doing until you are comfortable again and then slowly increase them. If you have problems or questions, call your caregiver or physical therapist for advice.   Rehabilitation is important following a joint replacement. After just a few days of immobilization, the muscles of the leg can become weakened and shrink (atrophy).  These exercises are designed to build up the tone and strength of the thigh and leg muscles and to improve motion. Often times heat used for twenty to thirty minutes before working out will loosen up your tissues and help with improving the range of motion but do not use heat for the first two weeks following surgery (sometimes heat can increase post-operative swelling).   These exercises can be done on a training (exercise) mat, on the floor, on a table or on a bed. Use whatever works the best and is most comfortable for you.    Use music or television while you are exercising so that the exercises are a pleasant break in your day. This will make your life better with the exercises acting as a break in your routine that you can look forward to.   Perform all exercises about fifteen times, three times per day or as directed.  You should exercise both the operative leg and the other leg as well.  Exercises include:   Quad Sets - Tighten up the muscle on the front of the thigh (Quad) and hold for 5-10 seconds.   Straight Leg  Raises - With your knee straight (if you were given a brace, keep it on), lift the leg to 60 degrees, hold for 3 seconds, and slowly lower the leg.  Perform this exercise against resistance later as your leg gets stronger.  Leg Slides: Lying on your back, slowly slide your foot toward your buttocks, bending your knee up off the floor (only go as far as is comfortable). Then slowly slide your foot back down until your leg is flat on the floor again.  Angel Wings: Lying on your back spread your legs to the side as far apart as you can without causing discomfort.  Hamstring Strength:  Lying on your back, push your heel against the floor with your leg straight by tightening up the muscles of your buttocks.  Repeat, but this time bend your knee to a comfortable angle, and push your heel against the floor.  You may put a pillow under the heel to make it more comfortable if necessary.   A rehabilitation program following joint replacement surgery can speed recovery and prevent re-injury in the future due to weakened muscles. Contact your doctor or a physical therapist for more information on knee rehabilitation.    CONSTIPATION  Constipation is defined medically as fewer than three stools per week and severe constipation as less than one stool per week.  Even if you have a regular bowel pattern at home, your normal regimen is likely to be disrupted due to multiple reasons following surgery.  Combination of anesthesia, postoperative narcotics, change in appetite and fluid intake all can affect your bowels.   YOU MUST use at least one of the following options; they are listed in order of increasing  strength to get the job done.  They are all available over the counter, and you may need to use some, POSSIBLY even all of these options:    Drink plenty of fluids (prune juice may be helpful) and high fiber foods Colace 100 mg by mouth twice a day  Senokot for constipation as directed and as needed Dulcolax  (bisacodyl), take with full glass of water  Miralax (polyethylene glycol) once or twice a day as needed.  If you have tried all these things and are unable to have a bowel movement in the first 3-4 days after surgery call either your surgeon or your primary doctor.    If you experience loose stools or diarrhea, hold the medications until you stool forms back up.  If your symptoms do not get better within 1 week or if they get worse, check with your doctor.  If you experience "the worst abdominal pain ever" or develop nausea or vomiting, please contact the office immediately for further recommendations for treatment.   ITCHING:  If you experience itching with your medications, try taking only a single pain pill, or even half a pain pill at a time.  You can also use Benadryl over the counter for itching or also to help with sleep.   TED HOSE STOCKINGS:  Use stockings on both legs until for at least 2 weeks or as directed by physician office. They may be removed at night for sleeping.  MEDICATIONS:  See your medication summary on the "After Visit Summary" that nursing will review with you.  You may have some home medications which will be placed on hold until you complete the course of blood thinner medication.  It is important for you to complete the blood thinner medication as prescribed.  PRECAUTIONS:  If you experience chest pain or shortness of breath - call 911 immediately for transfer to the hospital emergency department.   If you develop a fever greater that 101 F, purulent drainage from wound, increased redness or drainage from wound, foul odor from the wound/dressing, or calf pain - CONTACT YOUR SURGEON.                                                   FOLLOW-UP APPOINTMENTS:  If you do not already have a post-op appointment, please call the office for an appointment to be seen by your surgeon.  Guidelines for how soon to be seen are listed in your "After Visit Summary", but are typically  between 1-4 weeks after surgery.  OTHER INSTRUCTIONS:   Knee Replacement:  Do not place pillow under knee, focus on keeping the knee straight while resting. CPM instructions: 0-90 degrees, 2 hours in the morning, 2 hours in the afternoon, and 2 hours in the evening. Place foam block, curve side up under heel at all times except when in CPM or when walking.  DO NOT modify, tear, cut, or change the foam block in any way.   MAKE SURE YOU:  Understand these instructions.  Get help right away if you are not doing well or get worse.    Thank you for letting us be a part of your medical care team.  It is a privilege we respect greatly.  We hope these instructions will help you stay on track for a fast and full recovery!   Diagnostic Studies:  DG Knee Right Port  Result Date: 02/21/2022 CLINICAL DATA:  Post knee replacement EXAM: PORTABLE RIGHT KNEE - 1-2 VIEW COMPARISON:  None Available. FINDINGS: Post right total knee arthroplasty. Postoperative soft tissue swelling and air. IMPRESSION: Standard appearance post right total knee arthroplasty. Electronically Signed   By: Guadlupe SpanishPraneil  Patel M.D.   On: 02/21/2022 16:32   PERIPHERAL VASCULAR CATHETERIZATION  Result Date: 02/12/2022 Images from the original result were not included. Patient name: Leonidas RombergMary Dirusso MRN: [id number] DOB: October 27, 1945 Sex: female 02/12/2022 Pre-operative Diagnosis: History of pulmonary embolus with need for right total knee arthroplasty Post-operative diagnosis:  Same Surgeon:  Luanna SalkBrandon C. Randie Heinzain, MD Procedure Performed: 1.  Ultrasound-guided cannulation right common femoral vein 2.  IVC venogram 3.  Placement of Cook Celect IVC filter 4.  Moderate sedation with fentanyl and Versed for 9 minutes Indications: 76 year old female history of pulmonary embolus from left lower extremity DVT now indicated for right total knee arthroplasty.  We are placing a filter to prevent patient from pulmonary embolus in the perioperative timeframe. Findings: The  IVC measures less than 20 mm.  The filter was deployed in an upright position at the bottom of L2 where the left renal vein was identified by venogram. We will plan to remove filter and 3 months time if patient has recovered from her surgery without any further plan procedures  Procedure:  The patient was identified in the holding area and taken to room 8.  The patient was then placed supine on the table and prepped and draped in the usual sterile fashion.  A time out was called.  Ultrasound was used to evaluate the right common femoral vein which was patent and compressible.  The area was anesthetized with 1% lidocaine cannulated micropuncture needle followed by wire and sheath.  Images saved the permanent record.  Bentson wire was placed followed by the introducer sheath over the wire.  Venogram was performed.  We placed the introducer sheath to the level of the left renal vein at the bottom of L2.  The filter was then deployed at that location.  An image was saved.  Sheath was removed and pressure held till hemostasis obtained.  She tolerated procedure well without any complication. Contrast: 30cc Brandon C. Randie Heinzain, MD [facility] Office: [phone] Pager: [phone]    Disposition: Discharge disposition: 01-Home or Self Care       Discharge Instructions     Call MD / Call 911   Complete by: As directed    If you experience chest pain or shortness of breath, CALL 911 and be transported to the hospital emergency room.  If you develope a fever above 101 F, pus (white drainage) or increased drainage or redness at the wound, or calf pain, call your surgeon's office.   Change dressing   Complete by: As directed    Do not remove your dressing.   Constipation Prevention   Complete by: As directed    Drink plenty of fluids.  Prune juice may be helpful.  You may use a stool softener, such as Colace (over the counter) 100 mg twice a day.  Use MiraLax (over the counter)  for constipation as needed.   Diet - low sodium heart healthy   Complete by: As directed    Discharge instructions   Complete by: As directed    Elevate toes above nose. Use cryotherapy as needed for pain and swelling.   Do not put a pillow under the knee. Place it under  the heel.   Complete by: As directed    Driving restrictions   Complete by: As directed    No driving for 6 weeks   Increase activity slowly as tolerated   Complete by: As directed    Lifting restrictions   Complete by: As directed    No lifting for 6 weeks   Post-operative opioid taper instructions:   Complete by: As directed    POST-OPERATIVE OPIOID TAPER INSTRUCTIONS: It is important to wean off of your opioid medication as soon as possible. If you do not need pain medication after your surgery it is ok to stop day one. Opioids include: Codeine, Hydrocodone(Norco, Vicodin), Oxycodone(Percocet, oxycontin) and hydromorphone amongst others.  Long term and even short term use of opiods can cause: Increased pain response Dependence Constipation Depression Respiratory depression And more.  Withdrawal symptoms can include Flu like symptoms Nausea, vomiting And more Techniques to manage these symptoms Hydrate well Eat regular healthy meals Stay active Use relaxation techniques(deep breathing, meditating, yoga) Do Not substitute Alcohol to help with tapering If you have been on opioids for less than two weeks and do not have pain than it is ok to stop all together.  Plan to wean off of opioids This plan should start within one week post op of your joint replacement. Maintain the same interval or time between taking each dose and first decrease the dose.  Cut the total daily intake of opioids by one tablet each day Next start to increase the time between doses. The last dose that should be eliminated is the evening dose.      TED hose   Complete by: As directed    Use stockings (TED hose) for 2 weeks on  both leg(s).  You may remove them at night for sleeping.   Weight bearing as tolerated   Complete by: As directed         Follow-up Information     Swinte[place], Arlys John, MD Follow up in 2 week(s).   Specialty: Orthopedic Surgery Why: For suture removal, For wound re-check Contact information: [address] [place] [address] [phone]                  Signed: Clois Dupes, PA-C 02/23/2022, 10:22 AM

## 2022-04-10 ENCOUNTER — Other Ambulatory Visit: Payer: Self-pay

## 2022-04-10 DIAGNOSIS — Z95828 Presence of other vascular implants and grafts: Secondary | ICD-10-CM

## 2022-05-09 ENCOUNTER — Ambulatory Visit: Payer: Medicare Other | Admitting: Vascular Surgery

## 2022-05-21 ENCOUNTER — Ambulatory Visit (HOSPITAL_COMMUNITY)
Admission: RE | Admit: 2022-05-21 | Discharge: 2022-05-21 | Disposition: A | Discharge status: Home / Self Care | Payer: Medicare Other | Attending: Vascular Surgery | Admitting: Vascular Surgery

## 2022-05-21 ENCOUNTER — Encounter (HOSPITAL_COMMUNITY): Payer: Self-pay | Admitting: Vascular Surgery

## 2022-05-21 ENCOUNTER — Encounter (HOSPITAL_COMMUNITY)
Admission: RE | Disposition: A | Discharge status: Home / Self Care | Payer: Self-pay | Source: Home / Self Care | Attending: Vascular Surgery

## 2022-05-21 ENCOUNTER — Other Ambulatory Visit: Payer: Self-pay

## 2022-05-21 DIAGNOSIS — Z4589 Encounter for adjustment and management of other implanted devices: Secondary | ICD-10-CM | POA: Diagnosis present

## 2022-05-21 DIAGNOSIS — Z86711 Personal history of pulmonary embolism: Secondary | ICD-10-CM | POA: Diagnosis not present

## 2022-05-21 DIAGNOSIS — Z95828 Presence of other vascular implants and grafts: Secondary | ICD-10-CM

## 2022-05-21 DIAGNOSIS — Z86718 Personal history of other venous thrombosis and embolism: Secondary | ICD-10-CM | POA: Insufficient documentation

## 2022-05-21 DIAGNOSIS — Z96651 Presence of right artificial knee joint: Secondary | ICD-10-CM | POA: Insufficient documentation

## 2022-05-21 HISTORY — PX: IVC FILTER REMOVAL: CATH118246

## 2022-05-21 LAB — POCT I-STAT, CHEM 8
BUN: 31 mg/dL — ABNORMAL HIGH (ref 8–23)
Calcium, Ion: 1.18 mmol/L (ref 1.15–1.40)
Chloride: 102 mmol/L (ref 98–111)
Creatinine, Ser: 0.9 mg/dL (ref 0.44–1.00)
Glucose, Bld: 108 mg/dL — ABNORMAL HIGH (ref 70–99)
HCT: 35 % — ABNORMAL LOW (ref 36.0–46.0)
Hemoglobin: 11.9 g/dL — ABNORMAL LOW (ref 12.0–15.0)
Potassium: 4.7 mmol/L (ref 3.5–5.1)
Sodium: 141 mmol/L (ref 135–145)
TCO2: 30 mmol/L (ref 22–32)

## 2022-05-21 SURGERY — IVC FILTER REMOVAL
Anesthesia: LOCAL

## 2022-05-21 MED ORDER — HEPARIN (PORCINE) IN NACL 1000-0.9 UT/500ML-% IV SOLN
INTRAVENOUS | Status: DC | PRN
  Administered 2022-05-21: 500 mL

## 2022-05-21 MED ORDER — LIDOCAINE HCL (PF) 1 % IJ SOLN
INTRAMUSCULAR | Status: AC
  Filled 2022-05-21: qty 30

## 2022-05-21 MED ORDER — FENTANYL CITRATE (PF) 100 MCG/2ML IJ SOLN
INTRAMUSCULAR | Status: DC | PRN
  Administered 2022-05-21: 25 ug via INTRAVENOUS

## 2022-05-21 MED ORDER — MIDAZOLAM HCL 2 MG/2ML IJ SOLN
INTRAMUSCULAR | Status: AC
  Filled 2022-05-21: qty 2

## 2022-05-21 MED ORDER — LIDOCAINE HCL (PF) 1 % IJ SOLN
INTRAMUSCULAR | Status: DC | PRN
  Administered 2022-05-21: 2 mL via SUBCUTANEOUS

## 2022-05-21 MED ORDER — FENTANYL CITRATE (PF) 100 MCG/2ML IJ SOLN
INTRAMUSCULAR | Status: AC
  Filled 2022-05-21: qty 2

## 2022-05-21 MED ORDER — SODIUM CHLORIDE 0.9 % IV SOLN: INTRAVENOUS | Status: DC

## 2022-05-21 MED ORDER — HEPARIN (PORCINE) IN NACL 1000-0.9 UT/500ML-% IV SOLN
INTRAVENOUS | Status: AC
  Filled 2022-05-21: qty 500

## 2022-05-21 MED ORDER — IODIXANOL 320 MG/ML IV SOLN
INTRAVENOUS | Status: DC | PRN
  Administered 2022-05-21: 10 mL

## 2022-05-21 MED ORDER — MIDAZOLAM HCL 2 MG/2ML IJ SOLN
INTRAMUSCULAR | Status: DC | PRN
  Administered 2022-05-21: .5 mg via INTRAVENOUS

## 2022-05-21 SURGICAL SUPPLY — 8 items
KIT MICROPUNCTURE NIT STIFF (SHEATH) IMPLANT
KIT PV (KITS) ×1 IMPLANT
PROTECTION STATION PRESSURIZED (MISCELLANEOUS) ×1
SET CLOVERSNARE FLT RETRIEVAL (MISCELLANEOUS) IMPLANT
STATION PROTECTION PRESSURIZED (MISCELLANEOUS) IMPLANT
TRANSDUCER W/STOPCOCK (MISCELLANEOUS) ×1 IMPLANT
TRAY PV CATH (CUSTOM PROCEDURE TRAY) ×1 IMPLANT
WIRE BENTSON .035X145CM (WIRE) IMPLANT

## 2022-05-21 NOTE — Op Note (Signed)
    Patient name: Sharon Glass MRN: [id number] DOB: 07-May-1946 Sex: female  05/21/2022 Pre-operative Diagnosis: History of PE with recent right total knee arthroplasty Post-operative diagnosis:  Same Surgeon:  Eda Paschal. Donzetta Matters, MD Procedure Performed: 1.  Ultrasound-guided cannulation right internal jugular vein 2.  IVC filter retrieval 3.  Central venogram 4.  Moderate sedation with femoral Versed for 10 minutes  Indications: 76 year old female with history of IVC filter placement for history of PE with recent right total knee arthroplasty.  She is now indicated for filter retrieval.  Findings: Culture was sitting midline and was removed without complication and completion venography demonstrated patent IVC.   Procedure:  The patient was identified in the holding area and taken to room 8.  The patient was then placed supine on the table and prepped and draped in the usual sterile fashion.  A time out was called.  Ultrasound was used to evaluate the right IJ which was noted to be patent and compressible.  The area was anesthetized 1% lidocaine and concomitantly moderate sedation was administered with fentanyl and Versed and her vital signs were monitored throughout the case.  The right IJ was cannulated with micropuncture needle followed by wire and sheath with direct ultrasound visualization and images saved the permanent record.  I then placed a Bentson wire down to the IVC and then placed the retrieval sheath.  Filter was snared and the sheath was advanced over the filter was removed intact completion venogram demonstrated patency of the IVC without injury.  Sheath was removed and pressure held.  Patient tolerated procedure without any complication.  Contrast: 10 cc    Gram Siedlecki C. Donzetta Matters, MD [facility] Office: [phone] Pager: [phone]

## 2022-05-21 NOTE — H&P (Signed)
H+P     History of Present Illness: This is a 76 y.o. female with plan for total knee replacement later this month.  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  She has had right tka.        Past Medical History:  Diagnosis Date   Anemia     Arthritis     Asthma     Cellulitis     Clotting disorder ([place])     Depression     DVT (deep venous thrombosis) (HCC)     Hyperlipidemia     Nervous breakdown     Pain in limb     Pulmonary embolism (HCC)     TIA (transient ischemic attack)     Venous insufficiency             Past Surgical History:  Procedure Laterality Date   ABDOMINAL HYSTERECTOMY       CARPAL TUNNEL RELEASE       FOOT SURGERY       JOINT REPLACEMENT       REPLACEMENT TOTAL KNEE        left      No Known Allergies          Prior to Admission medications   Medication Sig Start Date End Date Taking? Authorizing Provider  amLODipine (NORVASC) 10 MG tablet Take 10 mg by mouth daily.       [provider]  atorvastatin (LIPITOR) 80 MG tablet Take 80 mg by mouth at bedtime.       [provider]  cephALEXin (KEFLEX) 250 MG capsule Take 250 mg by mouth daily. 90 day supply. Started 01/19/22 01/19/22     [provider]  cetirizine (ZYRTEC) 10 MG tablet Take 10 mg by mouth daily.       [provider]  Cyanocobalamin (B-12) 2500 MCG TABS Take 2,500 mcg by mouth daily.       [provider]  donepezil (ARICEPT) 5 MG tablet Take 5 mg by mouth at bedtime.       [provider]  escitalopram (LEXAPRO) 20 MG tablet Take 20 mg by mouth daily. 01/21/22     [provider]  Evening Primrose Oil 500 MG CAPS Take 500 mg by mouth in the morning, at noon, and at bedtime.       [provider]  famotidine (PEPCID) 20 MG tablet Take 20 mg by mouth 2 (two) times daily.       [provider]  ferrous sulfate 325 (65 FE) MG tablet Take 325 mg by mouth daily with breakfast.        [provider]  fluticasone furoate-vilanterol (BREO ELLIPTA) 100-25 MCG/ACT AEPB Inhale 1 puff into the lungs daily as needed (asthma).       [provider]  irbesartan (AVAPRO) 300 MG tablet Take 300 mg by mouth daily. 01/19/22     [provider]  memantine (NAMENDA) 10 MG tablet Take 10 mg by mouth 2 (two) times daily.       [provider]  metFORMIN (GLUCOPHAGE) 500 MG tablet Take 500 mg by mouth daily with breakfast. 01/19/22     [provider]  metoprolol tartrate (LOPRESSOR) 25 MG tablet Take 25 mg by mouth 2 (two) times daily.       [provider]  montelukast (SINGULAIR) 10 MG tablet Take 10 mg by mouth at bedtime.  [provider]  pregabalin (LYRICA) 50 MG capsule Take 50 mg by mouth 3 (three) times daily.       [provider]  vitamin E 180 MG (400 UNITS) capsule Take 400 Units by mouth daily.       [provider]      Social History         Socioeconomic History   Marital status: Single      Spouse name: Not on file   Number of children: Not on file   Years of education: Not on file   Highest education level: Not on file  Occupational History   Not on file  Tobacco Use   Smoking status: Never   Smokeless tobacco: Not on file  Substance and Sexual Activity   Alcohol use: No   Drug use: No   Sexual activity: Not on file  Other Topics Concern   Not on file  Social History Narrative   Not on file    Social Determinants of Health    Financial Resource Strain: Not on file  Food Insecurity: Not on file  Transportation Needs: Not on file  Physical Activity: Not on file  Stress: Not on file  Social Connections: Not on file  Intimate Partner Violence: Not on file             Family History  Problem Relation Age of Onset   Diabetes Father        ROS: No complaints today     Physical Examination  Awake alert oriented Nonlabored respirations No swelling bilateral  lower extremities     CBC         Component Value Date/Time    WBC 9.1 04/19/2009 1548    RBC 5.01 04/19/2009 1548    HGB 12.4 04/19/2009 1548    HCT 38.9 04/19/2009 1548    PLT 317 04/19/2009 1548    MCV 77.6 (L) 04/19/2009 1548    MCHC 32.0 04/19/2009 1548    RDW 18.7 (H) 04/19/2009 1548    LYMPHSABS 0.5 (L) 04/19/2009 1548    MONOABS 0.4 04/19/2009 1548    EOSABS 0.0 04/19/2009 1548    BASOSABS 0.1 04/19/2009 1548      BMET          Component Value Date/Time    NA 139 04/19/2009 1548    K 3.7 04/19/2009 1548    CL 106 04/19/2009 1548    CO2 28 04/19/2009 1548    GLUCOSE 119 (H) 04/19/2009 1548    BUN 10 04/19/2009 1548    CREATININE 0.68 04/19/2009 1548    CALCIUM 9.0 04/19/2009 1548    GFRNONAA >60 04/19/2009 1548    GFRAA   04/19/2009 1548      >60        The eGFR has been calculated using the MDRD equation. This calculation has not been validated in all clinical situations. eGFR's persistently <60 mL/min signify possible Chronic Kidney Disease.      COAGS:      Lab Results  Component Value Date    INR 2.9 (H) 04/21/2009    INR 2.9 (H) 04/20/2009    INR 2.6 (H) 04/19/2009        Non-Invasive Vascular Imaging:   No studies   ASSESSMENT/PLAN: This is a 76 y.o. female history of pulmonary embolism now s/p R tka. Will take ivc filter out today.   March Steyer C. Donzetta Matters, MD [facility] Pager: [phone]

## 2023-01-21 IMAGING — DX DG KNEE 1-2V PORT*R*
2 series · 2 of 2 positions shown · non-contrast
Comparison: None Available.

CLINICAL DATA: Post knee replacement

EXAM:
PORTABLE RIGHT KNEE - 1-2 VIEW

[knee ap]
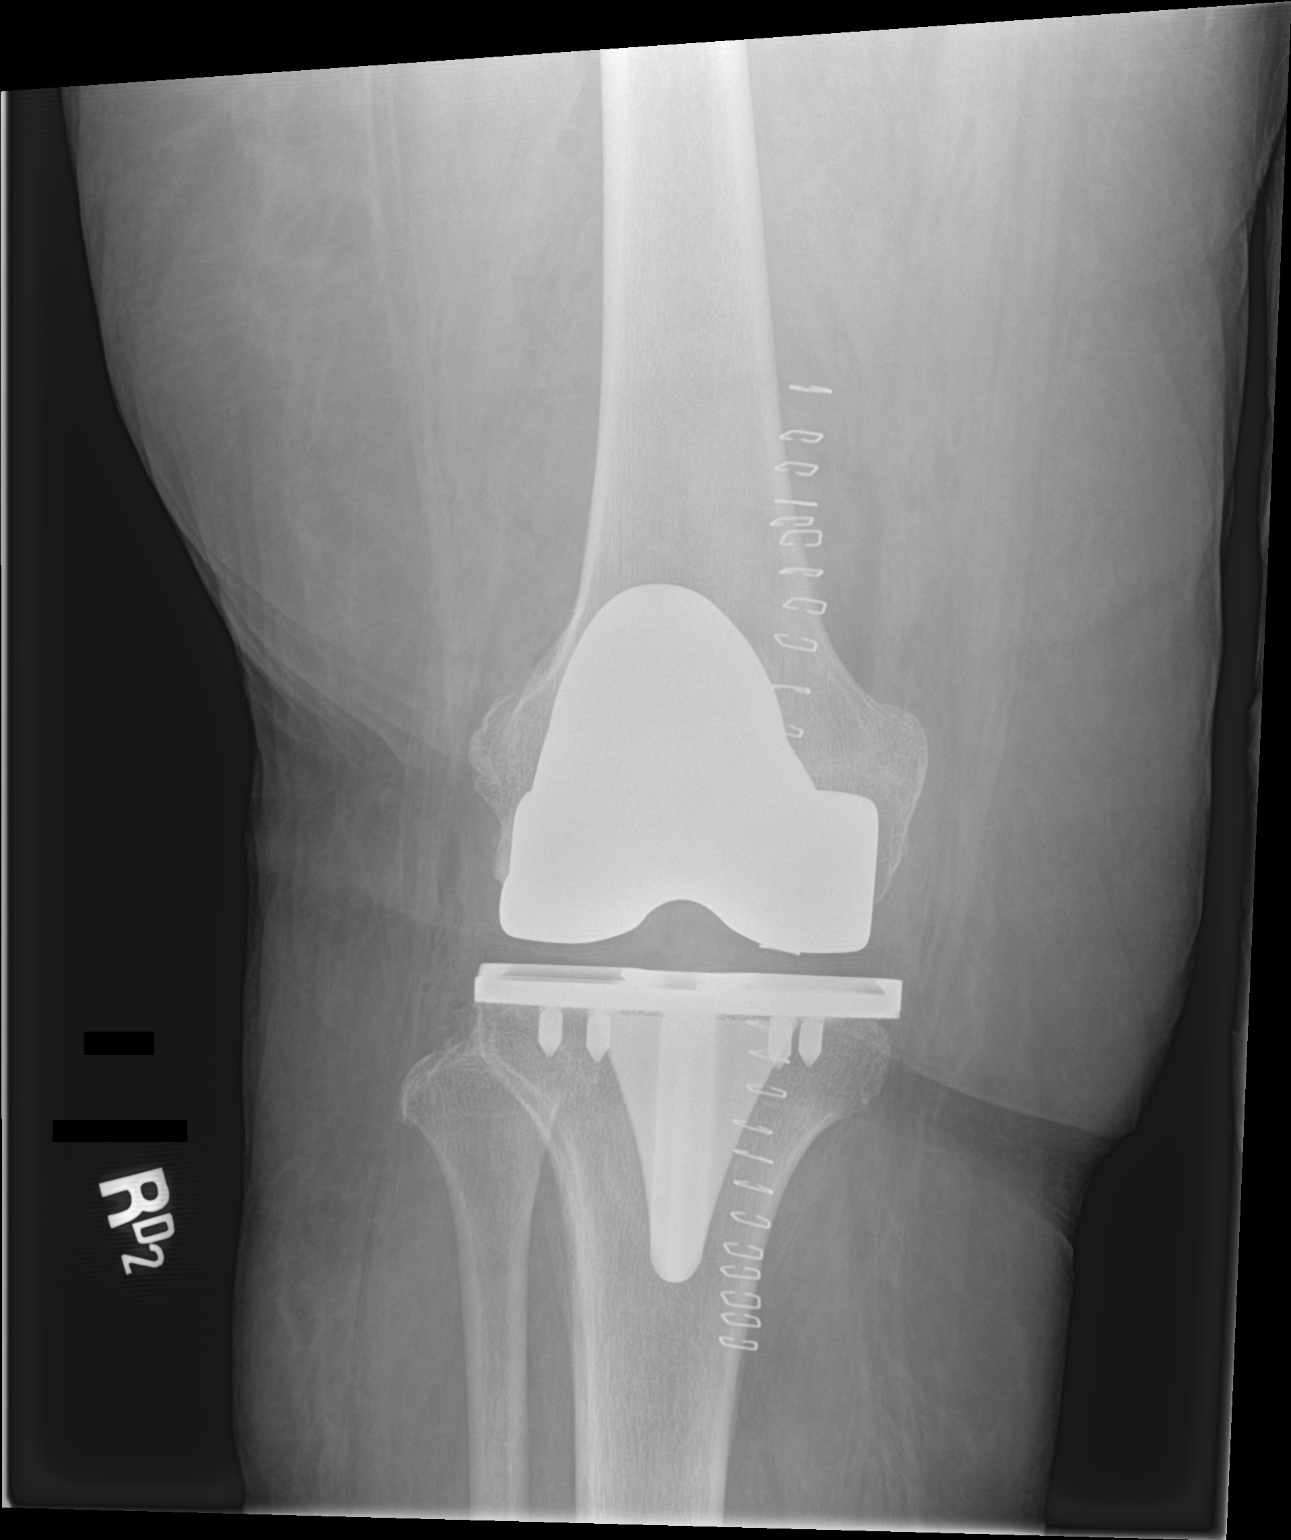

[knee lat]
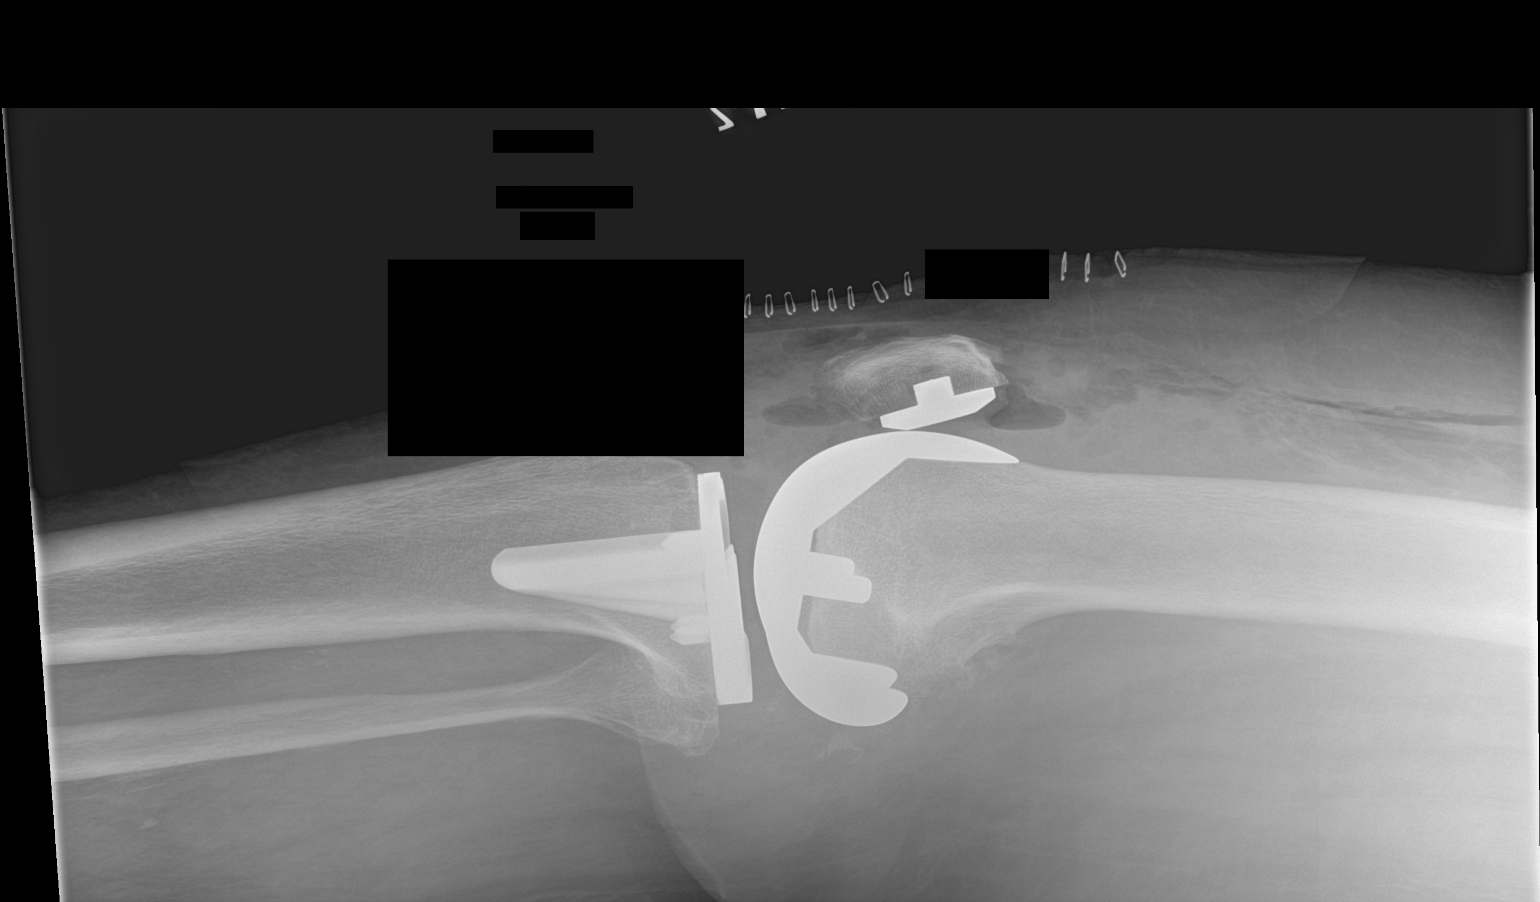

[2 of 2 positions shown; findings below may reference images not displayed]

FINDINGS: Post right total knee arthroplasty. Postoperative soft tissue
swelling and air.
IMPRESSION: Standard appearance post right total knee arthroplasty.

## 2023-02-22 ENCOUNTER — Other Ambulatory Visit: Payer: Self-pay | Admitting: *Deleted

## 2023-02-22 DIAGNOSIS — M79604 Pain in right leg: Secondary | ICD-10-CM

## 2023-03-06 ENCOUNTER — Ambulatory Visit (HOSPITAL_COMMUNITY)
Admission: RE | Admit: 2023-03-06 | Discharge: 2023-03-06 | Disposition: A | Discharge status: Home / Self Care | Payer: Medicare Other | Source: Ambulatory Visit | Attending: Vascular Surgery | Admitting: Vascular Surgery

## 2023-03-06 ENCOUNTER — Ambulatory Visit: Payer: Medicare Other | Admitting: Physician Assistant

## 2023-03-06 VITALS — BP 162/82 | HR 61 | Temp 97.9°F | Wt 266.0 lb

## 2023-03-06 DIAGNOSIS — I872 Venous insufficiency (chronic) (peripheral): Secondary | ICD-10-CM | POA: Diagnosis not present

## 2023-03-06 DIAGNOSIS — M79604 Pain in right leg: Secondary | ICD-10-CM | POA: Diagnosis present

## 2023-03-06 DIAGNOSIS — M79605 Pain in left leg: Secondary | ICD-10-CM | POA: Diagnosis not present

## 2023-03-06 NOTE — Progress Notes (Signed)
Requested by:  [employer] [address] [place],  [place] [address]  Reason for consultation: bilateral lower extremity edema    History of Present Illness   Sharon Glass is a 77 y.o. (Jun 02, 1946) female who presents for evaluation of bilateral lower extremity edema. She states she has had lower leg swelling since the early 2000s. She has a history of LLE DVT in the 1990s briefly treated with Coumadin. She has previously been seen by [facility] in 2012 for worsening left leg swelling after a knee replacement. On duplex she had no evidence of GSV reflux and it was felt her leg swelling was due to valvular damage from her DVT. She was recommended conservative treatment with compression stockings and elevation.  At our office today she states her lower extremity swelling was stable for many years until her right knee replacement last year. She has had slightly worsened right lower leg swelling since her surgery. Her right ankle gets fairly swollen by the end of the day, especially after prolonged sitting. She has thigh high and knee high compression stockings but does not like to wear the knee highs because the sock cuts into her skin. She does not elevate her legs much.   She denies any recent history of DVT. She has had bilateral vein operations in [place] a few years ago, but she is unsure what she had done. She cannot recall if her leg swelling changed at all after her operations.  Past Medical History:  Diagnosis Date   Anemia    Arthritis    Asthma    Cellulitis    Clotting disorder (HCC)    Depression    Diabetes mellitus without complication (HCC)    DVT (deep venous thrombosis) (HCC)    GERD (gastroesophageal reflux disease)    History of hiatal hernia    Hyperlipidemia    Hypertension    Nervous breakdown    Neuromuscular disorder (HCC)    neuropathy feet   Pain in limb    Pulmonary embolism (HCC)    Sleep apnea    wears CPAP   TIA  (transient ischemic attack)    Venous insufficiency     Past Surgical History:  Procedure Laterality Date   ABDOMINAL HYSTERECTOMY     CARPAL TUNNEL RELEASE     EYE SURGERY Bilateral    cataract   FOOT SURGERY Left    IVC FILTER INSERTION N/A 02/12/2022   Procedure: IVC FILTER INSERTION;  Surgeon: Maeola Harman, MD;  Location: [facility] INVASIVE CV LAB;  Service: Cardiovascular;  Laterality: N/A;   IVC FILTER REMOVAL N/A 05/21/2022   Procedure: IVC FILTER REMOVAL;  Surgeon: Maeola Harman, MD;  Location: [facility] INVASIVE CV LAB;  Service: Cardiovascular;  Laterality: N/A;   KNEE ARTHROPLASTY Right 02/21/2022   Procedure: COMPUTER ASSISTED TOTAL KNEE ARTHROPLASTY;  Surgeon: Samson Frederic, MD;  Location: [facility] ORS;  Service: Orthopedics;  Laterality: Right;   REPLACEMENT TOTAL KNEE     left    Social History   Socioeconomic History   Marital status: Single    Spouse name: Not on file   Number of children: Not on file   Years of education: Not on file   Highest education level: Not on file  Occupational History   Not on file  Tobacco Use   Smoking status: Never   Smokeless tobacco: Not on file  Vaping Use   Vaping Use: Never used  Substance and Sexual Activity  Alcohol use: Never   Drug use: Never   Sexual activity: Not Currently  Other Topics Concern   Not on file  Social History Narrative   Not on file   Social Determinants of Health   Financial Resource Strain: Not on file  Food Insecurity: Not on file  Transportation Needs: Not on file  Physical Activity: Not on file  Stress: Not on file  Social Connections: Not on file  Intimate Partner Violence: Not on file    Family History  Problem Relation Age of Onset   Diabetes Father     Current Outpatient Medications  Medication Sig Dispense Refill   amLODipine (NORVASC) 10 MG tablet Take 10 mg by mouth daily.     atorvastatin (LIPITOR) 80 MG tablet Take 80 mg by mouth at bedtime.     cetirizine  (ZYRTEC) 10 MG tablet Take 10 mg by mouth 2 (two) times daily.     donepezil (ARICEPT) 5 MG tablet Take 5 mg by mouth at bedtime.     escitalopram (LEXAPRO) 20 MG tablet Take 20 mg by mouth daily.     famotidine (PEPCID) 20 MG tablet Take 20 mg by mouth 2 (two) times daily.     ferrous sulfate 325 (65 FE) MG tablet Take 325 mg by mouth 3 (three) times daily with meals.     fluticasone furoate-vilanterol (BREO ELLIPTA) 100-25 MCG/ACT AEPB Inhale 1 puff into the lungs daily as needed (asthma).     furosemide (LASIX) 40 MG tablet Take 40 mg by mouth every morning.     ipratropium-albuterol (DUONEB) 0.5-2.5 (3) MG/3ML SOLN Take 3 mLs by nebulization every 4 (four) hours as needed (Shortness of breath).     irbesartan (AVAPRO) 300 MG tablet Take 300 mg by mouth daily.     memantine (NAMENDA) 10 MG tablet Take 10 mg by mouth 2 (two) times daily.     metFORMIN (GLUCOPHAGE) 500 MG tablet Take 500 mg by mouth daily with breakfast.     methocarbamol (ROBAXIN) 500 MG tablet Take 1 tablet (500 mg total) by mouth every 6 (six) hours as needed for muscle spasms. 20 tablet 0   metoprolol tartrate (LOPRESSOR) 25 MG tablet Take 25 mg by mouth 2 (two) times daily.     montelukast (SINGULAIR) 10 MG tablet Take 10 mg by mouth at bedtime.       pregabalin (LYRICA) 50 MG capsule Take 50 mg by mouth 3 (three) times daily.     rivaroxaban (XARELTO) 10 MG TABS tablet Take 1 tablet (10 mg total) by mouth daily with breakfast. 30 tablet 1   Vitamin D, Ergocalciferol, (DRISDOL) 1.25 MG (50000 UNIT) CAPS capsule Take 50,000 Units by mouth 2 (two) times a week.     vitamin E 180 MG (400 UNITS) capsule Take 400 Units by mouth daily.     No current facility-administered medications for this visit.    Allergies  Allergen Reactions   Lisinopril Other (See Comments)     cough    REVIEW OF SYSTEMS (negative unless checked):   Cardiac:  []  Chest pain or chest pressure? []  Shortness of breath upon activity? []  Shortness  of breath when lying flat? []  Irregular heart rhythm?  Vascular:  []  Pain in calf, thigh, or hip brought on by walking? []  Pain in feet at night that wakes you up from your sleep? []  Blood clot in your veins? [x]  Leg swelling?  Pulmonary:  []  Oxygen at home? []  Productive cough? []  Wheezing?  Neurologic:  []   Sudden weakness in arms or legs? []  Sudden numbness in arms or legs? []  Sudden onset of difficult speaking or slurred speech? []  Temporary loss of vision in one eye? []  Problems with dizziness?  Gastrointestinal:  []  Blood in stool? []  Vomited blood?  Genitourinary:  []  Burning when urinating? []  Blood in urine?  Psychiatric:  []  Major depression  Hematologic:  []  Bleeding problems? []  Problems with blood clotting?  Dermatologic:  []  Rashes or ulcers?  Constitutional:  []  Fever or chills?  Ear/Nose/Throat:  []  Change in hearing? []  Nose bleeds? []  Sore throat?  Musculoskeletal:  []  Back pain? []  Joint pain? []  Muscle pain?   Physical Examination     Vitals:   03/06/23 1455  BP: (!) 162/82  Pulse: 61  Temp: 97.9 F (36.6 C)  TempSrc: Temporal  SpO2: 98%  Weight: 266 lb (120.7 kg)   Body mass index is 39.28 kg/m.  General:  WDWN in NAD; vital signs documented above Gait: Not observed HENT: WNL, normocephalic Pulmonary: normal non-labored breathing , without Rales, rhonchi,  wheezing Cardiac: regular Abdomen: soft, NT, no masses Skin: without rashes Vascular Exam/Pulses: palpable pedal pulses Extremities: minimal LLE edema, 1+ right ankle edema. Stasis pigmentation of BLE. No ulcerations or lipodermatosclerosis Musculoskeletal: no muscle wasting or atrophy  Neurologic: A&O X 3;  No focal weakness or paresthesias are detected Psychiatric:  The pt has Normal affect.  Non-invasive Vascular Imaging   RLE Venous Insufficiency Duplex (03/06/2023):  +--------------+---------+------+-----------+------------+-----------------  --+  RIGHT         Reflux NoRefluxReflux TimeDiameter cmsComments                                      Yes                                               +--------------+---------+------+-----------+------------+-----------------  --+  CFV                    yes   >1 second                                   +--------------+---------+------+-----------+------------+-----------------  --+  FV mid        no                                                          +--------------+---------+------+-----------+------------+-----------------  --+  Popliteal    no                                                          +--------------+---------+------+-----------+------------+-----------------  --+  GSV at SFJ              yes    >500 ms      0.78                          +--------------+---------+------+-----------+------------+-----------------  --+  GSV prox thighno                            0.40                          +--------------+---------+------+-----------+------------+-----------------  --+  GSV mid thigh no                            0.43                          +--------------+---------+------+-----------+------------+-----------------  --+  GSV dist thigh                                      out of fascia         +--------------+---------+------+-----------+------------+-----------------  --+  GSV at knee                                         Not well  visualized  +--------------+---------+------+-----------+------------+-----------------  --+  SSV Pop Fossa no                            0.19                          +--------------+---------+------+-----------+------------+-----------------    Medical Decision Making   Amie Tonkinson is a 77 y.o. female who presents bilateral lower extremity edema  Based on duplex, there is reflux in the right common femoral vein and GSV at the saphenofemoral  junction. The remainder of her deep and superficial venous system is competent. There is no evidence of DVT or SVT. She is not a candidate for venous ablation She has a long history of bilateral lower extremity swelling, recently worse on the right after a knee replacement. She sometimes uses compression and elevates her legs On exam she has stasis pigmentation bilaterally with 1+ edema of the right ankle. I believe most of her edema is due to deep system reflux I discussed with her the importance of wearing compression stockings, elevating her legs, and exercising to reduce leg swelling. She has thigh high compression stockings at home that she is going to try. She can follow up with our office as needed   Ernestene Mention, PA-C [facility] [place] Office: [phone]  03/09/2023, 9:13 AM  Clinic MD: Randie Heinz
